# Patient Record
Sex: Female | Born: 1975 | Race: White | Hispanic: Yes | Marital: Married | Smoking: Never smoker
Health system: Southern US, Community
[De-identification: ages and names within clinical notes are randomized; demographics above are authoritative.]

## PROBLEM LIST (undated history)

## (undated) DIAGNOSIS — Z9889 Other specified postprocedural states: Secondary | ICD-10-CM

## (undated) DIAGNOSIS — R112 Nausea with vomiting, unspecified: Secondary | ICD-10-CM

## (undated) DIAGNOSIS — Z973 Presence of spectacles and contact lenses: Secondary | ICD-10-CM

## (undated) DIAGNOSIS — J45909 Unspecified asthma, uncomplicated: Secondary | ICD-10-CM

## (undated) DIAGNOSIS — D649 Anemia, unspecified: Secondary | ICD-10-CM

## (undated) DIAGNOSIS — G43109 Migraine with aura, not intractable, without status migrainosus: Secondary | ICD-10-CM

## (undated) HISTORY — DX: Migraine with aura, not intractable, without status migrainosus: G43.109

## (undated) HISTORY — DX: Anemia, unspecified: D64.9

## (undated) HISTORY — DX: Unspecified asthma, uncomplicated: J45.909

---
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
## 2013-12-29 ENCOUNTER — Ambulatory Visit (INDEPENDENT_AMBULATORY_CARE_PROVIDER_SITE_OTHER): Payer: 59 | Admitting: Family Medicine

## 2013-12-29 ENCOUNTER — Ambulatory Visit (INDEPENDENT_AMBULATORY_CARE_PROVIDER_SITE_OTHER): Payer: 59

## 2013-12-29 VITALS — BP 126/88 | HR 86 | Temp 97.9°F | Resp 18 | Ht 66.0 in | Wt 192.6 lb

## 2013-12-29 DIAGNOSIS — M436 Torticollis: Secondary | ICD-10-CM

## 2013-12-29 DIAGNOSIS — M542 Cervicalgia: Secondary | ICD-10-CM

## 2013-12-29 MED ORDER — METHOCARBAMOL 500 MG PO TABS: ORAL_TABLET | ORAL | Status: DC

## 2013-12-29 NOTE — Patient Instructions (Signed)
Use the neck collar as needed for support, but do not use it when you are driving. Use the robaxin as needed for neck pain but remember it may cause drowsiness. You can also try a heating pad, head patch, etc.  Let me know if you do not feel better in the next couple of days

## 2013-12-29 NOTE — Progress Notes (Signed)
[facility] [address], [place] [address] [phone]  Date:  12/29/2013   Name:  Stacy Ochoa   DOB:  1975/11/01   MRN:  [id number]  PCP:  No PCP Per Patient    Chief Complaint: Torticollis and Shoulder Pain   History of Present Illness:  Stacy Ochoa is a 38 y.o. very pleasant female patient who presents with the following:  She is here today with a pain in her neck.  This is Wednesday- on Sunday she noted onset of a "knot" in her thoracic back.  The next day she felt worse, and has stiffness on the left side of her neck also.   She feels a bit tingly in the left hand.   She has seen PM and R for a similar issue in the past and had an injection of some sort while she was living in [place]  She works for the [employer].  She is originally from [place] about 2 weeks ago.  No trauma, fall, MVA.    She is otherwise generally healthy and feels well   There are no active problems to display for this patient.   Past Medical History  Diagnosis Date  . Asthma     History reviewed. No pertinent past surgical history.  History  Substance Use Topics  . Smoking status: Never Smoker   . Smokeless tobacco: Not on file  . Alcohol Use: No    Family History  Problem Relation Age of Onset  . Hypertension Mother   . Diabetes Mother   . Heart disease Maternal Grandfather     Allergies  Allergen Reactions  . Aspirin     Medication list has been reviewed and updated.  No current outpatient prescriptions on file prior to visit.   No current facility-administered medications on file prior to visit.    Review of Systems:  As per HPI- otherwise negative.   Physical Examination: Filed Vitals:   12/29/13 1007  BP: 126/88  Pulse: 86  Temp: 97.9 F (36.6 C)  Resp: 18   Filed Vitals:   12/29/13 1007  Height: 5\' 6"  (1.676 m)  Weight: 192 lb 9.6 oz (87.363 kg)   Body mass index is 31.1 kg/(m^2). Ideal Body Weight: Weight  in (lb) to have BMI = 25: 154.6  GEN: WDWN, NAD, Non-toxic, A & O x 3, overweight, looks well HEENT: Atraumatic, Normocephalic. Neck supple. No masses, No LAD.  Bilateral TM wnl, oropharynx normal.  PEERL,EOMI.   She is tender over the right shoulder blade, right trapezius and right sided neck muscles. Good cervical ROM except she is slightly limited in rotation to the left  Ears and Nose: No external deformity. CV: RRR, No M/G/R. No JVD. No thrill. No extra heart sounds. PULM: CTA B, no wheezes, crackles, rhonchi. No retractions. No resp. distress. No accessory muscle use. EXTR: No c/c/e NEURO Normal gait.  PSYCH: Normally interactive. Conversant. Not depressed or anxious appearing.  Calm demeanor.   Tried a soft cervical collar which felt comfortable to her.    [facility] reading (PRIMARY) by  Dr. Patsy Lager. Cervical spine:  Loss of lordosis and minimal degenerative change, OW normal  CERVICAL SPINE 4+ VIEWS  COMPARISON: None.  FINDINGS: The cervical vertebrae are straightened in alignment. Intervertebral disc spaces appear normal. No prevertebral soft tissue swelling is seen. On oblique views the foramina are patent. The odontoid process is intact. The lung apices are clear.  IMPRESSION: Straightened alignment.  Normal disc spaces. No foraminal narrowing.  Assessment and Plan: Neck pain - Plan: DG Cervical Spine Complete, methocarbamol (ROBAXIN) 500 MG tablet  Torticollis - Plan: DG Cervical Spine Complete, methocarbamol (ROBAXIN) 500 MG tablet  Neck pain due to MSK strain/toritcollis.  Treat with robaxin as needed, soft collar as needed,  She will let me know if not better in the next few days  Signed Abbe Amsterdam, MD

## 2015-06-23 DIAGNOSIS — J4521 Mild intermittent asthma with (acute) exacerbation: Secondary | ICD-10-CM | POA: Insufficient documentation

## 2017-04-22 DIAGNOSIS — Z862 Personal history of diseases of the blood and blood-forming organs and certain disorders involving the immune mechanism: Secondary | ICD-10-CM

## 2017-04-22 HISTORY — DX: Personal history of diseases of the blood and blood-forming organs and certain disorders involving the immune mechanism: Z86.2

## 2017-09-05 ENCOUNTER — Ambulatory Visit (INDEPENDENT_AMBULATORY_CARE_PROVIDER_SITE_OTHER): Payer: 59 | Admitting: Obstetrics & Gynecology

## 2017-09-05 ENCOUNTER — Other Ambulatory Visit: Payer: Self-pay

## 2017-09-05 ENCOUNTER — Encounter: Payer: Self-pay | Admitting: Obstetrics & Gynecology

## 2017-09-05 VITALS — BP 110/70 | HR 88 | Resp 16 | Ht 64.5 in | Wt 177.2 lb

## 2017-09-05 DIAGNOSIS — N921 Excessive and frequent menstruation with irregular cycle: Secondary | ICD-10-CM | POA: Diagnosis not present

## 2017-09-05 DIAGNOSIS — G43909 Migraine, unspecified, not intractable, without status migrainosus: Secondary | ICD-10-CM | POA: Insufficient documentation

## 2017-09-05 DIAGNOSIS — D500 Iron deficiency anemia secondary to blood loss (chronic): Secondary | ICD-10-CM | POA: Diagnosis not present

## 2017-09-05 NOTE — Progress Notes (Signed)
GYNECOLOGY  VISIT  CC:   Heavy bleeding  HPI: 42 y.o. G70P2002 Married Hispanic female here as new patient for menstrual cycle problems.  Bleeding has been heavier during the past year and a half.  Flow has gotten much heavier during this time.  Does pass clots.  She developed some mild iron deficiency anemia.    Cycles used to be about every 28 days and are now every 201 or 24 days.  Flow lasts 5-7 days.  First three days her flow is very heavy.  She uses two pads at a time.  She has to change pads every two hours.  Clots can be larger than golf balls.  Does have migraines and these are very cycle related.  On preventative treatment that has really helped.    Also having some depression symptoms.    PCP:  Dr. Helane Rima.    GYNECOLOGIC HISTORY: Patient's last menstrual period was 08/28/2017. Contraception: vasectomy  Menopausal hormone therapy: none  Patient Active Problem List   Diagnosis Date Noted  . Migraines 09/05/2017  . Mild intermittent asthma with acute exacerbation 06/23/2015    Past Medical History:  Diagnosis Date  . Anemia 2019   due to heavy vag bleeding   . Asthma   . Migraine with aura    gets ~ 1-2 a month - controlled with medication     Past Surgical History:  Procedure Laterality Date  . BREAST REDUCTION SURGERY  1994    MEDS:   Current Outpatient Medications on File Prior to Visit  Medication Sig Dispense Refill  . EMGALITY 120 MG/ML SOAJ INJECT 120 MG SQ ONCE MONTHLY  10  . fluticasone (FLONASE) 50 MCG/ACT nasal spray Place 1 spray into both nostrils daily.    . montelukast (SINGULAIR) 10 MG tablet Take by mouth.    . Multiple Vitamin (MULTIVITAMIN) tablet Take 1 tablet by mouth daily.    Marland Kitchen topiramate (TOPAMAX) 100 MG tablet Take 100 mg by mouth daily.  0  . vitamin B-12 (CYANOCOBALAMIN) 1000 MCG tablet Take 1,000 mcg by mouth daily.    Marland Kitchen albuterol (PROVENTIL HFA) 108 (90 Base) MCG/ACT inhaler Inhale into the lungs daily as needed.    Marland Kitchen  albuterol (PROVENTIL) (2.5 MG/3ML) 0.083% nebulizer solution as needed.    . baclofen (LIORESAL) 10 MG tablet Take 1-2 tablets by mouth 3 (three) times daily as needed.    . eletriptan (RELPAX) 40 MG tablet Take 1 tablet by mouth daily as needed.    Marland Kitchen EPINEPHrine (EPIPEN 2-PAK) 0.3 mg/0.3 mL IJ SOAJ injection as directed.     No current facility-administered medications on file prior to visit.     ALLERGIES: Aspirin  Family History  Problem Relation Age of Onset  . Hypertension Mother   . Diabetes Mother   . Heart disease Maternal Grandfather   . Osteoporosis Maternal Aunt     SH:  Married, non smoker  Review of Systems  All other systems reviewed and are negative.   PHYSICAL EXAMINATION:    BP 110/70 (BP Location: Left Arm, Patient Position: Sitting, Cuff Size: Normal)   Pulse 88   Resp 16   Ht 5' 4.5" (1.638 m)   Wt 177 lb 3.2 oz (80.4 kg)   LMP 08/28/2017   BMI 29.95 kg/m     General appearance: alert, cooperative and appears stated age Neck: no adenopathy, supple, symmetrical, trachea midline and thyroid normal to inspection and palpation CV:  Regular rate and rhythm Lungs:  clear to  auscultation, no wheezes, rales or rhonchi, symmetric air entry Abdomen: soft, non-tender; bowel sounds normal; no masses,  no organomegaly  Pelvic: External genitalia:  no lesions              Urethra:  normal appearing urethra with no masses, tenderness or lesions              Bartholins and Skenes: normal                 Vagina: normal appearing vagina with normal color and discharge, no lesions              Cervix: no lesions              Bimanual Exam:  Uterus: about 10 weeks in size, retroflexed, fundus feels enlarged              Adnexa: no mass, fullness, tenderness  Chaperone was present for exam.  Assessment: Menorrhagia  Plan: TSH, Iron and TIBC, Ferritin, CBC obtained today Return for PUS.  Possible treatment options discussed.  Will review in more detail after  ultrasound is complete.   ~35 minutes spent with patient >50% of time was in face to face discussion of above.

## 2017-09-06 LAB — CBC
Hematocrit: 34.8 % (ref 34.0–46.6)
Hemoglobin: 10.4 g/dL — ABNORMAL LOW (ref 11.1–15.9)
MCH: 24.2 pg — AB (ref 26.6–33.0)
MCHC: 29.9 g/dL — ABNORMAL LOW (ref 31.5–35.7)
MCV: 81 fL (ref 79–97)
Platelets: 390 10*3/uL — ABNORMAL HIGH (ref 150–379)
RBC: 4.3 x10E6/uL (ref 3.77–5.28)
RDW: 15.9 % — AB (ref 12.3–15.4)
WBC: 7.3 10*3/uL (ref 3.4–10.8)

## 2017-09-06 LAB — IRON AND TIBC
Iron Saturation: 5 % — CL (ref 15–55)
Iron: 22 ug/dL — ABNORMAL LOW (ref 27–159)
TIBC: 465 ug/dL — AB (ref 250–450)
UIBC: 443 ug/dL — ABNORMAL HIGH (ref 131–425)

## 2017-09-06 LAB — TSH: TSH: 0.874 u[IU]/mL (ref 0.450–4.500)

## 2017-09-06 LAB — FERRITIN: Ferritin: 8 ng/mL — ABNORMAL LOW (ref 15–150)

## 2017-09-08 ENCOUNTER — Encounter: Payer: Self-pay | Admitting: Obstetrics & Gynecology

## 2017-09-08 ENCOUNTER — Other Ambulatory Visit: Payer: Self-pay | Admitting: *Deleted

## 2017-09-08 DIAGNOSIS — N921 Excessive and frequent menstruation with irregular cycle: Secondary | ICD-10-CM

## 2017-09-08 DIAGNOSIS — D500 Iron deficiency anemia secondary to blood loss (chronic): Secondary | ICD-10-CM

## 2017-09-08 NOTE — Addendum Note (Signed)
Addended by: Megan Salon on: 09/08/2017 10:44 PM   Modules accepted: Level of Service

## 2017-09-09 ENCOUNTER — Ambulatory Visit: Payer: 59 | Admitting: Obstetrics & Gynecology

## 2017-09-09 ENCOUNTER — Ambulatory Visit (INDEPENDENT_AMBULATORY_CARE_PROVIDER_SITE_OTHER): Payer: 59

## 2017-09-09 ENCOUNTER — Encounter: Payer: Self-pay | Admitting: Obstetrics & Gynecology

## 2017-09-09 VITALS — BP 102/70 | HR 72 | Resp 14 | Ht 64.5 in | Wt 177.0 lb

## 2017-09-09 DIAGNOSIS — D500 Iron deficiency anemia secondary to blood loss (chronic): Secondary | ICD-10-CM

## 2017-09-09 DIAGNOSIS — D219 Benign neoplasm of connective and other soft tissue, unspecified: Secondary | ICD-10-CM

## 2017-09-09 DIAGNOSIS — N921 Excessive and frequent menstruation with irregular cycle: Secondary | ICD-10-CM

## 2017-09-09 NOTE — Progress Notes (Signed)
Following appointment with Dr Sabra Heck, appointment scheduled with hematology for Sep 18, 2017 at 245. Surgery date options discussed for August 2019 following patient planned vacation.

## 2017-09-09 NOTE — Progress Notes (Signed)
42 y.o. G31P2002 Married Hispanic female here for pelvic ultrasound due to enlarged uterus on exam, iron deficiency anemia, and to discuss treatment plans.  Reports she's had more fatigue this week.  We reviewed lab work and iron levels.  Aware she needs hematology referral and iron transfusions to improve iron levels.  Pt comfortable with this plan.   Patient's last menstrual period was 08/28/2017.  Contraception: vasectomy  Findings:  UTERUS: 10 x 5 x 5cm with 5.2 x 4.1cm and 4.2 x 3.0cm fibroids that appear pedunculated at the left fundal region as well as a 1 x 1cm intramural fibroid. EMS: 8.24mm ADNEXA: Left ovary: 2.2 x 1.6 x 1.5cm       Right ovary: 2.5 x 2.0 x 1.5cm CUL DE SAC: no free fluid  Discussion:  Findings reviewed with pt.  Options for fibroid treatment reviewed--myomectomy, uterine artery embolization, progesterone IUD, hysterectomy.  I do not think an ablation will work that well for her due to size of fibroids.  She is considering hysterectomy.  I think, she will feel much better with an iron transfusion and improved hemoglobin.  Also, I think she will recover better from surgery if iron levels are improved as well.  She is very comfortable with plan.   Assessment:  Iron deficiency anemia Fibroid uterus  Plan:  Referral to hematology with probable iron transfusions.  Information about hysterectomy given.  She has already done reading about fibroid treatment.  ~20 minutes spent with patient >50% of time was in face to face discussion of above.

## 2017-09-17 ENCOUNTER — Other Ambulatory Visit: Payer: Self-pay | Admitting: Family

## 2017-09-17 DIAGNOSIS — E559 Vitamin D deficiency, unspecified: Secondary | ICD-10-CM

## 2017-09-17 DIAGNOSIS — D649 Anemia, unspecified: Secondary | ICD-10-CM

## 2017-09-17 DIAGNOSIS — D510 Vitamin B12 deficiency anemia due to intrinsic factor deficiency: Secondary | ICD-10-CM

## 2017-09-18 ENCOUNTER — Inpatient Hospital Stay (HOSPITAL_BASED_OUTPATIENT_CLINIC_OR_DEPARTMENT_OTHER): Payer: 59 | Admitting: Family

## 2017-09-18 ENCOUNTER — Other Ambulatory Visit: Payer: Self-pay

## 2017-09-18 ENCOUNTER — Inpatient Hospital Stay: Payer: 59 | Attending: Hematology & Oncology

## 2017-09-18 ENCOUNTER — Encounter: Payer: Self-pay | Admitting: Family

## 2017-09-18 DIAGNOSIS — D510 Vitamin B12 deficiency anemia due to intrinsic factor deficiency: Secondary | ICD-10-CM

## 2017-09-18 DIAGNOSIS — D500 Iron deficiency anemia secondary to blood loss (chronic): Secondary | ICD-10-CM | POA: Diagnosis not present

## 2017-09-18 DIAGNOSIS — N920 Excessive and frequent menstruation with regular cycle: Secondary | ICD-10-CM | POA: Insufficient documentation

## 2017-09-18 DIAGNOSIS — D649 Anemia, unspecified: Secondary | ICD-10-CM

## 2017-09-18 DIAGNOSIS — D509 Iron deficiency anemia, unspecified: Secondary | ICD-10-CM | POA: Insufficient documentation

## 2017-09-18 DIAGNOSIS — E559 Vitamin D deficiency, unspecified: Secondary | ICD-10-CM

## 2017-09-18 LAB — CBC WITH DIFFERENTIAL (CANCER CENTER ONLY)
Basophils Absolute: 0 10*3/uL (ref 0.0–0.1)
Basophils Relative: 0 %
Eosinophils Absolute: 0.1 10*3/uL (ref 0.0–0.5)
Eosinophils Relative: 1 %
HEMATOCRIT: 33.9 % — AB (ref 34.8–46.6)
Hemoglobin: 10.5 g/dL — ABNORMAL LOW (ref 11.6–15.9)
LYMPHS ABS: 1.7 10*3/uL (ref 0.9–3.3)
LYMPHS PCT: 25 %
MCH: 24.8 pg — ABNORMAL LOW (ref 26.0–34.0)
MCHC: 31 g/dL — AB (ref 32.0–36.0)
MCV: 80 fL — AB (ref 81.0–101.0)
MONO ABS: 0.4 10*3/uL (ref 0.1–0.9)
MONOS PCT: 6 %
NEUTROS ABS: 4.6 10*3/uL (ref 1.5–6.5)
Neutrophils Relative %: 68 %
Platelet Count: 298 10*3/uL (ref 145–400)
RBC: 4.24 MIL/uL (ref 3.70–5.32)
RDW: 15.2 % (ref 11.1–15.7)
WBC Count: 6.9 10*3/uL (ref 3.9–10.0)

## 2017-09-18 LAB — CMP (CANCER CENTER ONLY)
ALBUMIN: 4 g/dL (ref 3.5–5.0)
ALK PHOS: 99 U/L (ref 40–150)
ALT: 12 U/L (ref 0–55)
AST: 15 U/L (ref 5–34)
Anion gap: 8 (ref 3–11)
BILIRUBIN TOTAL: 0.3 mg/dL (ref 0.2–1.2)
BUN: 16 mg/dL (ref 7–26)
CO2: 22 mmol/L (ref 22–29)
Calcium: 9.4 mg/dL (ref 8.4–10.4)
Chloride: 108 mmol/L (ref 98–109)
Creatinine: 0.95 mg/dL (ref 0.60–1.10)
GFR, Est AFR Am: >60 mL/min (ref >60)
GFR, Estimated: >60 mL/min (ref >60)
GLUCOSE: 114 mg/dL (ref 70–140)
Potassium: 3.5 mmol/L (ref 3.5–5.1)
Sodium: 138 mmol/L (ref 136–145)
TOTAL PROTEIN: 7.3 g/dL (ref 6.4–8.3)

## 2017-09-18 LAB — SAVE SMEAR

## 2017-09-18 LAB — RETICULOCYTES
RBC.: 4.27 MIL/uL (ref 3.70–5.45)
Retic Count, Absolute: 42.7 10*3/uL (ref 33.7–90.7)
Retic Ct Pct: 1 % (ref 0.7–2.1)

## 2017-09-18 LAB — VITAMIN B12: Vitamin B-12: 295 pg/mL (ref 180–914)

## 2017-09-18 NOTE — Progress Notes (Signed)
Hematology/Oncology Consultation   Name: Camella Seim      MRN: [id number]    Location: Room/bed info not found  Date: 09/18/2017 Time:2:41 PM   REFERRING PHYSICIAN: Megan Salon, MD  REASON FOR CONSULT: Iron deficiency anemia due to chronic blood loss    DIAGNOSIS: Iron deficiency secondary to   HISTORY OF PRESENT ILLNESS: Ms. Segar is a very pleasant 42 yo [place] female with iron deficiency anemia secondary to heavy cycles. She is scheduled for a hysterectomy in August.  Her ferritin is 8 and iron saturation 5%. She is symptomatic at this time with weakness, fatigue, lightheadedness, palpitations, SOB with any exertion, chewing ice, bruising easily and numbness and tingling in her extremities.  She has had no other issues with bleeding. She has had surgery (breast reduction) without complication.  She has 2 healthy sons and no history of miscarriage.  No family history of anemia or bleeding disorder.  No personal or familial history of cancer that she is aware of.  No fever, chills, n/v, cough, rash, chest pain, abdominal pain or changes in bowel or bladder habits.  No swelling or tenderness in her extremities. No c/o pain. She has a good appetite and is staying well hydrated. Her weight is stable.  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  She is excited to go home for a visit to [place] in July and is hoping to feel better for her trip.    ROS: All other 10 point review of systems is negative.   PAST MEDICAL HISTORY:   Past Medical History:  Diagnosis Date  . Anemia 2019   due to heavy vag bleeding   . Asthma   . Migraine with aura    gets ~ 1-2 a month - controlled with medication     ALLERGIES: Allergies  Allergen Reactions  . Aspirin Anaphylaxis      MEDICATIONS:  Current Outpatient Medications on File Prior to Visit  Medication Sig Dispense Refill  . albuterol (PROVENTIL HFA) 108 (90 Base) MCG/ACT inhaler  Inhale into the lungs daily as needed.    Marland Kitchen albuterol (PROVENTIL) (2.5 MG/3ML) 0.083% nebulizer solution as needed.    . baclofen (LIORESAL) 10 MG tablet Take 1-2 tablets by mouth 3 (three) times daily as needed.    . eletriptan (RELPAX) 40 MG tablet Take 1 tablet by mouth daily as needed.    Marland Kitchen EMGALITY 120 MG/ML SOAJ INJECT 120 MG SQ ONCE MONTHLY  10  . EPINEPHrine (EPIPEN 2-PAK) 0.3 mg/0.3 mL IJ SOAJ injection as directed.    . fluticasone (FLONASE) 50 MCG/ACT nasal spray Place 1 spray into both nostrils daily.    . montelukast (SINGULAIR) 10 MG tablet Take by mouth.    . Multiple Vitamin (MULTIVITAMIN) tablet Take 1 tablet by mouth daily.    Marland Kitchen topiramate (TOPAMAX) 100 MG tablet Take 100 mg by mouth daily.  0  . vitamin B-12 (CYANOCOBALAMIN) 1000 MCG tablet Take 1,000 mcg by mouth daily.     No current facility-administered medications on file prior to visit.      PAST SURGICAL HISTORY Past Surgical History:  Procedure Laterality Date  . BREAST REDUCTION SURGERY  1994    FAMILY HISTORY: Family History  Problem Relation Age of Onset  . Hypertension Mother   . Diabetes Mother   . Heart disease Maternal Grandfather   . Osteoporosis Maternal Aunt     SOCIAL HISTORY:  reports that she has  never smoked. She has never used smokeless tobacco. She reports that she does not drink alcohol or use drugs.  PERFORMANCE STATUS: The patient's performance status is 1 - Symptomatic but completely ambulatory  PHYSICAL EXAM: Most Recent Vital Signs: Last menstrual period 08/28/2017. BP 121/83 (BP Location: Left Arm, Patient Position: Sitting)   Pulse (!) 104   Temp 98.6 F (37 C) (Oral)   Resp 16   Wt 178 lb (80.7 kg)   LMP 08/28/2017   SpO2 100%   BMI 30.08 kg/m   General Appearance:    Alert, cooperative, no distress, appears stated age  Head:    Normocephalic, without obvious abnormality, atraumatic  Eyes:    PERRL, conjunctiva/corneas clear, EOM's intact, fundi    benign, both  eyes  Ears:    Normal TM's and external ear canals, both ears  Nose:   Nares normal, septum midline, mucosa normal, no drainage    or sinus tenderness  Throat:   Lips, mucosa, and tongue normal; teeth and gums normal  Neck:   Supple, symmetrical, trachea midline, no adenopathy;    thyroid:  no enlargement/tenderness/nodules; no carotid   bruit or JVD  Back:     Symmetric, no curvature, ROM normal, no CVA tenderness  Lungs:     Clear to auscultation bilaterally, respirations unlabored  Chest Wall:    No tenderness or deformity   Heart:    Regular rate and rhythm, S1 and S2 normal, no murmur, rub   or gallop  Breast Exam:    No tenderness, masses, or nipple abnormality  Abdomen:     Soft, non-tender, bowel sounds active all four quadrants,    no masses, no organomegaly  Genitalia:    Normal female without lesion, discharge or tenderness  Rectal:    Normal tone, normal prostate, no masses or tenderness;   guaiac negative stool  Extremities:   Extremities normal, atraumatic, no cyanosis or edema  Pulses:   2+ and symmetric all extremities  Skin:   Skin color, texture, turgor normal, no rashes or lesions  Lymph nodes:   Cervical, supraclavicular, and axillary nodes normal  Neurologic:   CNII-XII intact, normal strength, sensation and reflexes    throughout    LABORATORY DATA:  Results for orders placed or performed in visit on 09/18/17 (from the past 48 hour(s))  CBC with Differential ([place] Only)     Status: Abnormal   Collection Time: 09/18/17  2:16 PM  Result Value Ref Range   WBC Count 6.9 3.9 - 10.0 K/uL   RBC 4.24 3.70 - 5.32 MIL/uL   Hemoglobin 10.5 (L) 11.6 - 15.9 g/dL   HCT 33.9 (L) 34.8 - 46.6 %   MCV 80.0 (L) 81.0 - 101.0 fL   MCH 24.8 (L) 26.0 - 34.0 pg   MCHC 31.0 (L) 32.0 - 36.0 g/dL   RDW 15.2 11.1 - 15.7 %   Platelet Count 298 145 - 400 K/uL   Neutrophils Relative % 68 %   Neutro Abs 4.6 1.5 - 6.5 K/uL   Lymphocytes Relative 25 %   Lymphs Abs 1.7 0.9 - 3.3  K/uL   Monocytes Relative 6 %   Monocytes Absolute 0.4 0.1 - 0.9 K/uL   Eosinophils Relative 1 %   Eosinophils Absolute 0.1 0.0 - 0.5 K/uL   Basophils Relative 0 %   Basophils Absolute 0.0 0.0 - 0.1 K/uL    Comment: Performed at [facility], [address] [place], [place] [address]      RADIOGRAPHY: No results found.     PATHOLOGY: None  ASSESSMENT/PLAN: Ms. Norby is a very pleasant 42 yo [place] female with iron deficiency anemia secondary to heavy cycles. Iron saturation was 5% and ferritin 8. She is scheduled for a hysterectomy in August.  She is quite symptomatic as mentioned above.  We will give her iron tomorrow and again next week then follow-up in 6 weeks.   All questions were answered and she is in agreement with the plan. She will contact our office with any questions or concerns. We can certainly see her sooner if need be.   She was discussed with and also seen by Dr. Marin Olp and he is in agreement with the aforementioned.   Laverna Peace     Addendum: I saw and examined the patient with Judson Roch.  I agree with the above assessment by Judson Roch.  I looked at her smear under the microscope.  She had some microcytic red blood cells.  She had no nucleated red blood cells.  She had some hypochromic red blood cells.  Her iron studies clearly are low.  Her iron saturation is only 5%.  Her ferritin is 8.  She does have a normal vitamin B12 level.  We will go ahead and give her a dose of IV iron.  I know that this will help her out.  I do not see that any additional studies need to be done.  We spent a total of 40 minutes with her today.  We spent the time counseling her and answering all of her questions.  She was very satisfied with her meeting.  She was reassured that everything is going to be okay.  Lattie Haw, MD

## 2017-09-19 ENCOUNTER — Telehealth: Payer: Self-pay | Admitting: Family

## 2017-09-19 ENCOUNTER — Other Ambulatory Visit: Payer: Self-pay | Admitting: Family

## 2017-09-19 ENCOUNTER — Inpatient Hospital Stay: Payer: 59

## 2017-09-19 VITALS — BP 105/73 | HR 84 | Temp 97.6°F | Resp 16

## 2017-09-19 DIAGNOSIS — D500 Iron deficiency anemia secondary to blood loss (chronic): Secondary | ICD-10-CM

## 2017-09-19 DIAGNOSIS — E559 Vitamin D deficiency, unspecified: Secondary | ICD-10-CM

## 2017-09-19 LAB — LACTATE DEHYDROGENASE: LDH: 153 U/L (ref 125–245)

## 2017-09-19 LAB — VITAMIN D 25 HYDROXY (VIT D DEFICIENCY, FRACTURES): Vit D, 25-Hydroxy: 16.3 ng/mL — ABNORMAL LOW (ref 30.0–100.0)

## 2017-09-19 MED ORDER — ERGOCALCIFEROL 1.25 MG (50000 UT) PO CAPS: 50000.0000 [IU] | ORAL_CAPSULE | ORAL | 6 refills | Status: DC

## 2017-09-19 MED ORDER — SODIUM CHLORIDE 0.9 % IV SOLN
Freq: Once | INTRAVENOUS | Status: AC
  Administered 2017-09-19: 13:00:00 via INTRAVENOUS

## 2017-09-19 MED ORDER — SODIUM CHLORIDE 0.9 % IV SOLN
510.0000 mg | Freq: Once | INTRAVENOUS | Status: AC
  Administered 2017-09-19: 510 mg via INTRAVENOUS
  Filled 2017-09-19: qty 17

## 2017-09-19 NOTE — Patient Instructions (Signed)

## 2017-09-19 NOTE — Telephone Encounter (Signed)
I spoke with Ms. Jerrett and let her know her B 12 looked good and Vitamin D is low at 16. Prescription was sent for Vitamin D 50,000 units PO weekly. She verbalized understanding. No questions at this time.

## 2017-09-26 ENCOUNTER — Other Ambulatory Visit: Payer: Self-pay

## 2017-09-26 ENCOUNTER — Inpatient Hospital Stay: Payer: 59 | Attending: Hematology & Oncology

## 2017-09-26 VITALS — BP 107/74 | HR 80 | Temp 98.9°F | Resp 20

## 2017-09-26 DIAGNOSIS — N920 Excessive and frequent menstruation with regular cycle: Secondary | ICD-10-CM | POA: Diagnosis not present

## 2017-09-26 DIAGNOSIS — D500 Iron deficiency anemia secondary to blood loss (chronic): Secondary | ICD-10-CM | POA: Insufficient documentation

## 2017-09-26 MED ORDER — SODIUM CHLORIDE 0.9 % IV SOLN
510.0000 mg | Freq: Once | INTRAVENOUS | Status: AC
  Administered 2017-09-26: 510 mg via INTRAVENOUS
  Filled 2017-09-26: qty 17

## 2017-09-26 MED ORDER — SODIUM CHLORIDE 0.9 % IV SOLN
Freq: Once | INTRAVENOUS | Status: AC
  Administered 2017-09-26: 15:00:00 via INTRAVENOUS

## 2017-09-26 NOTE — Patient Instructions (Signed)

## 2017-11-06 ENCOUNTER — Telehealth: Payer: Self-pay | Admitting: *Deleted

## 2017-11-06 NOTE — Telephone Encounter (Signed)
Call to patient. Confirmed surgery date of 12-08-17 at 0730 at [facility]. Arrive at 0530. Surgery instruction sheet reviewed and printed copy will be provided at consult appointment on 11-10-17 with brochure from surgery center.    Routing to provider for review.  Will close encounter.

## 2017-11-07 ENCOUNTER — Inpatient Hospital Stay: Payer: 59 | Attending: Hematology & Oncology

## 2017-11-07 ENCOUNTER — Inpatient Hospital Stay (HOSPITAL_BASED_OUTPATIENT_CLINIC_OR_DEPARTMENT_OTHER): Payer: 59 | Admitting: Family

## 2017-11-07 VITALS — BP 114/81 | HR 74 | Temp 98.9°F | Wt 177.5 lb

## 2017-11-07 DIAGNOSIS — D500 Iron deficiency anemia secondary to blood loss (chronic): Secondary | ICD-10-CM | POA: Diagnosis present

## 2017-11-07 DIAGNOSIS — N920 Excessive and frequent menstruation with regular cycle: Secondary | ICD-10-CM

## 2017-11-07 LAB — RETICULOCYTES
RBC.: 4.67 MIL/uL (ref 3.87–5.11)
RETIC CT PCT: 0.8 % (ref 0.4–3.1)
Retic Count, Absolute: 37.4 10*3/uL (ref 19.0–186.0)

## 2017-11-07 LAB — CBC WITH DIFFERENTIAL (CANCER CENTER ONLY)
Basophils Absolute: 0 10*3/uL (ref 0.0–0.1)
Basophils Relative: 0 %
EOS ABS: 0.1 10*3/uL (ref 0.0–0.5)
Eosinophils Relative: 1 %
HCT: 40.6 % (ref 34.8–46.6)
Hemoglobin: 13.5 g/dL (ref 11.6–15.9)
LYMPHS ABS: 1.8 10*3/uL (ref 0.9–3.3)
LYMPHS PCT: 23 %
MCH: 29.1 pg (ref 26.0–34.0)
MCHC: 33.3 g/dL (ref 32.0–36.0)
MCV: 87.5 fL (ref 81.0–101.0)
MONO ABS: 0.5 10*3/uL (ref 0.1–0.9)
MONOS PCT: 7 %
Neutro Abs: 5.3 10*3/uL (ref 1.5–6.5)
Neutrophils Relative %: 69 %
Platelet Count: 262 10*3/uL (ref 145–400)
RBC: 4.64 MIL/uL (ref 3.70–5.32)
RDW: 17.3 % — ABNORMAL HIGH (ref 11.1–15.7)
WBC Count: 7.7 10*3/uL (ref 3.9–10.0)

## 2017-11-07 NOTE — Progress Notes (Signed)
Hematology and Oncology Follow Up Visit  Stacy Ochoa [id number] [birth year] 42 y.o. 11/07/2017   Principle Diagnosis:  Iron deficiency anemia due to heavy cycles Vitamin D deficiency  Current Therapy:   Iv iron as indicated - last received in May/June 2019 x 2 Vitamin D 50,000 units PO once a week   Interim History:  Stacy Ochoa is here today for follow-up. She did well with 2 doses of IV iron in May/June. She states that she started having symptoms again 2 weeks ago. She has noticed fatigue, feet/hand cramps, dizziness, chills, numbness in hands and dry skin/itching.  She started her cycle today and has been coming every 24 days. He flow is heavy. She is scheduled for her hysterectomy in August 19th.  She has had no other episodes of bleeding, no bruising or petechiae.  No fever, n/v, cough, rash, SOB, chest pain, palpitations, abdominal pain or changes in bowel or bladder habits.  No swelling or tenderness in her extremities at this time. No lymphadenopathy noted on exam.  She has a good appetite but admits that she needs to hydrate better. Her weight is stable.   ECOG Performance Status: 1 - Symptomatic but completely ambulatory  Medications:  Allergies as of 11/07/2017      Reactions   Aspirin Anaphylaxis      Medication List        Accurate as of 11/07/17  3:11 PM. Always use your most recent med list.          albuterol (2.5 MG/3ML) 0.083% nebulizer solution Commonly known as:  PROVENTIL as needed.   PROVENTIL HFA 108 (90 Base) MCG/ACT inhaler Generic drug:  albuterol Inhale into the lungs daily as needed.   baclofen 10 MG tablet Commonly known as:  LIORESAL Take 1-2 tablets by mouth 3 (three) times daily as needed.   EMGALITY 120 MG/ML Soaj Generic drug:  Galcanezumab-gnlm INJECT 120 MG SQ ONCE MONTHLY   EPIPEN 2-PAK 0.3 mg/0.3 mL Soaj injection Generic drug:  EPINEPHrine as directed.   ergocalciferol 50000 units capsule Commonly known as:  VITAMIN  D2 Take 1 capsule (50,000 Units total) by mouth once a week.   fluticasone 50 MCG/ACT nasal spray Commonly known as:  FLONASE Place 1 spray into both nostrils daily.   montelukast 10 MG tablet Commonly known as:  SINGULAIR Take by mouth.   multivitamin tablet Take 1 tablet by mouth daily.   RELPAX 40 MG tablet Generic drug:  eletriptan Take 1 tablet by mouth daily as needed.   topiramate 100 MG tablet Commonly known as:  TOPAMAX Take 100 mg by mouth daily.   vitamin B-12 1000 MCG tablet Commonly known as:  CYANOCOBALAMIN Take 1,000 mcg by mouth daily.       Allergies:  Allergies  Allergen Reactions  . Aspirin Anaphylaxis    Past Medical History, Surgical history, Social history, and Family History were reviewed and updated.  Review of Systems: All other 10 point review of systems is negative.   Physical Exam:  vitals were not taken for this visit.   Wt Readings from Last 3 Encounters:  09/18/17 178 lb (80.7 kg)  09/09/17 177 lb (80.3 kg)  09/05/17 177 lb 3.2 oz (80.4 kg)    Ocular: Sclerae unicteric, pupils equal, round and reactive to light Ear-nose-throat: Oropharynx clear, dentition fair Lymphatic: No cervical, supraclavicular or axillary adenopathy Lungs no rales or rhonchi, good excursion bilaterally Heart regular rate and rhythm, no murmur appreciated Abd soft, nontender, positive bowel sounds, no liver  or spleen tip palpated on exam, no fluid wave  MSK no focal spinal tenderness, no joint edema Neuro: non-focal, well-oriented, appropriate affect Breasts: Deferred   Lab Results  Component Value Date   WBC 7.7 11/07/2017   HGB 13.5 11/07/2017   HCT 40.6 11/07/2017   MCV 87.5 11/07/2017   PLT 262 11/07/2017   Lab Results  Component Value Date   FERRITIN 8 (L) 09/05/2017   IRON 22 (L) 09/05/2017   TIBC 465 (H) 09/05/2017   UIBC 443 (H) 09/05/2017   IRONPCTSAT 5 (LL) 09/05/2017   Lab Results  Component Value Date   RETICCTPCT 1.0  09/18/2017   RBC 4.64 11/07/2017   No results found for: KPAFRELGTCHN, LAMBDASER, KAPLAMBRATIO No results found for: IGGSERUM, IGA, IGMSERUM No results found for: Marda Stalker, SPEI   Chemistry      Component Value Date/Time   NA 138 09/18/2017 1416   K 3.5 09/18/2017 1416   CL 108 09/18/2017 1416   CO2 22 09/18/2017 1416   BUN 16 09/18/2017 1416   CREATININE 0.95 09/18/2017 1416      Component Value Date/Time   CALCIUM 9.4 09/18/2017 1416   ALKPHOS 99 09/18/2017 1416   AST 15 09/18/2017 1416   ALT 12 09/18/2017 1416   BILITOT 0.3 09/18/2017 1416      Impression and Plan: Stacy Ochoa is a very pleasant 42 yo [place] female with iron deficiency anemia secondary to heavy cycles. She is symptomatic as mentioned above.  We will see what her iron studies show and bring her back in for infusion if needed.  We will plan to see her back in another 8 weeks for follow-up.  She will contact our office with any questions or concerns. We can certainly see her sooner if need be.    Emeline Gins, NP 7/19/20193:11 PM

## 2017-11-10 ENCOUNTER — Other Ambulatory Visit (HOSPITAL_COMMUNITY)
Admission: RE | Admit: 2017-11-10 | Discharge: 2017-11-10 | Disposition: A | Discharge status: Home / Self Care | Payer: 59 | Source: Ambulatory Visit | Attending: Obstetrics & Gynecology | Admitting: Obstetrics & Gynecology

## 2017-11-10 ENCOUNTER — Ambulatory Visit: Payer: 59 | Admitting: Obstetrics & Gynecology

## 2017-11-10 ENCOUNTER — Other Ambulatory Visit: Payer: Self-pay

## 2017-11-10 ENCOUNTER — Encounter: Payer: Self-pay | Admitting: Obstetrics & Gynecology

## 2017-11-10 VITALS — BP 116/70 | HR 80 | Resp 16 | Ht 64.5 in | Wt 181.8 lb

## 2017-11-10 DIAGNOSIS — N920 Excessive and frequent menstruation with regular cycle: Secondary | ICD-10-CM | POA: Diagnosis not present

## 2017-11-10 DIAGNOSIS — D219 Benign neoplasm of connective and other soft tissue, unspecified: Secondary | ICD-10-CM

## 2017-11-10 DIAGNOSIS — Z124 Encounter for screening for malignant neoplasm of cervix: Secondary | ICD-10-CM | POA: Diagnosis not present

## 2017-11-10 LAB — IRON AND TIBC
IRON: 70 ug/dL (ref 41–142)
Saturation Ratios: 22 % (ref 21–57)
TIBC: 318 ug/dL (ref 236–444)
UIBC: 248 ug/dL

## 2017-11-10 LAB — FERRITIN: FERRITIN: 109 ng/mL (ref 11–307)

## 2017-11-10 NOTE — Progress Notes (Signed)
42 y.o. [id number] MarriedHispanic female here to discuss iron deficiency anemia and proceed with surgical planning.  She has decided she would like to proceed with hysterectomy.  Reports she is feeling much better after having two iron transfusions.  Has seen Laverna Peace, [place], hematology.  Just had blood work done last Friday.  Is going to check about having one additional iron transfusion.  She has decided she would like to proceed with hysterectomy.  TLH/bilateral salpingectomy/cysotscopy discussed with pt.  I feel she is a good candidate for this.  She has considered alternatives including [place], myomectomy, hormonal management but had decided to proceed with definitive treatment.  Procedure discussed with patient.  Hospital stay, recovery and pain management all discussed.  Risks discussed including but not limited to bleeding, 1% risk of receiving a  transfusion, infection, 3-4% risk of bowel/bladder/ureteral/vascular injury discussed as well as possible need for additional surgery if injury does occur discussed.  DVT/PE and rare risk of death discussed.  My actual complications with prior surgeries discussed.  Vaginal cuff dehiscence discussed.  Hernia formation discussed.  Positioning and incision locations discussed.  Patient aware if pathology abnormal she may need additional treatment.  All questions answered.    Recommend proceeding with endometrial biopsy today.  Ultrasound performed 09/09/17.  Results showed:  UTERUS: 10 x 5 x 5cm with 5.2 x 4.1cm and 4.2 x 3.0cm fibroids that appear pedunculated at the left fundal region as well as a 1 x 1cm intramural fibroid. EMS: 8.14mm ADNEXA: Left ovary: 2.2 x 1.6 x 1.5cm                  Right ovary: 2.5 x 2.0 x 1.5cm CUL DE SAC: no free fluid   Getting ready to go to [place] for fun.  She will be gone for two weeks.    Ob Hx:   Patient's last menstrual period was 11/06/2017.          Sexually active: Yes.   Birth control: vasectomy Last  pap: 04/17/15 Neg. HR HPV:Neg - Care everywhere Last MMG: 09/23/17 BIRADS1:Neg - Care everywhere  Tobacco: No  Past Surgical History:  Procedure Laterality Date  . BREAST REDUCTION SURGERY  1994    Past Medical History:  Diagnosis Date  . Anemia 2019   due to heavy vag bleeding   . Asthma   . Migraine with aura    gets ~ 1-2 a month - controlled with medication     Allergies: Aspirin and Shellfish allergy  Current Outpatient Medications  Medication Sig Dispense Refill  . albuterol (PROVENTIL HFA) 108 (90 Base) MCG/ACT inhaler Inhale into the lungs daily as needed.    Marland Kitchen albuterol (PROVENTIL) (2.5 MG/3ML) 0.083% nebulizer solution as needed.    . baclofen (LIORESAL) 10 MG tablet Take 1-2 tablets by mouth 3 (three) times daily as needed.    . eletriptan (RELPAX) 40 MG tablet Take 1 tablet by mouth daily as needed.    Marland Kitchen EMGALITY 120 MG/ML SOAJ INJECT 120 MG SQ ONCE MONTHLY  10  . ergocalciferol (VITAMIN D2) 50000 units capsule Take 1 capsule (50,000 Units total) by mouth once a week. 8 capsule 6  . fluticasone (FLONASE) 50 MCG/ACT nasal spray Place 1 spray into both nostrils daily.    . montelukast (SINGULAIR) 10 MG tablet Take 10 mg by mouth at bedtime.     . Multiple Vitamin (MULTIVITAMIN) tablet Take 1 tablet by mouth daily.    Marland Kitchen topiramate (TOPAMAX) 100 MG tablet Take  100 mg by mouth daily.  0  . EPINEPHrine (EPIPEN 2-PAK) 0.3 mg/0.3 mL IJ SOAJ injection as directed.     No current facility-administered medications for this visit.     ROS: Pertinent items noted in HPI and remainder of comprehensive ROS otherwise negative.  Exam:    BP 116/70 (BP Location: Right Arm, Patient Position: Sitting, Cuff Size: Large)   Pulse 80   Resp 16   Ht 5' 4.5" (1.638 m)   Wt 181 lb 12.8 oz (82.5 kg)   LMP 11/06/2017   BMI 30.72 kg/m   General appearance: alert and cooperative Head: Normocephalic, without obvious abnormality, atraumatic Neck: no adenopathy, supple, symmetrical, trachea  midline and thyroid not enlarged, symmetric, no tenderness/mass/nodules Lungs: clear to auscultation bilaterally Heart: regular rate and rhythm, S1, S2 normal, no murmur, click, rub or gallop Abdomen: soft, non-tender; bowel sounds normal; no masses,  no organomegaly Extremities: extremities normal, atraumatic, no cyanosis or edema Skin: Skin color, texture, turgor normal. No rashes or lesions Lymph nodes: Cervical, supraclavicular, and axillary nodes normal. no inguinal nodes palpated Neurologic: Grossly normal  Pelvic: External genitalia:  no lesions              Urethra: normal appearing urethra with no masses, tenderness or lesions              Bartholins and Skenes: normal                 Vagina: normal appearing vagina with normal color and discharge, no lesions              Cervix: normal appearance              Pap taken: Yes.          Bimanual Exam:  Uterus:  uterus is normal size, shape, consistency and nontender, enlarged to 10 week's size                                      Adnexa:    normal adnexa in size, nontender and no masses  Endometrial biopsy recommended.  Discussed with patient.  Verbal and written consent obtained.   Procedure:  Speculum placed.  Cervix visualized and cleansed with betadine prep.  A single toothed tenaculum was applied to the anterior lip of the cervix.  Endometrial pipelle was advanced through the cervix into the endometrial cavity without difficulty.  Pipelle passed to 8cm.  Suction applied and pipelle removed with good tissue sample obtained.  Tenculum removed.  No bleeding noted.  Patient tolerated procedure well.   A: Menorrhagia Iron deficiency anemia, s/p iron transfusions x 2 with improved hemoglobin Fibroid uterus  P:  Proceed with surgical planning for TLH/bilateral salpingectomy/cystoscopy  Endometrial biopsy and pap smear pending.  Results will be called to pt. Pre and post op instructions reviewed.  Questions answered.

## 2017-11-12 LAB — CYTOLOGY - PAP
Diagnosis: NEGATIVE
HPV: NOT DETECTED

## 2017-11-14 ENCOUNTER — Other Ambulatory Visit: Payer: Self-pay | Admitting: Obstetrics & Gynecology

## 2017-11-28 NOTE — Patient Instructions (Addendum)
Stacy Ochoa  11/28/2017      Your procedure is scheduled on 12-08-17   Report to [facility]  at  5:30 A.M.  Call this number if you have problems the morning of surgery:[phone]  OUR ADDRESS IS [address], WE ARE [facility]ATED [facility] WITH [facility].   Remember:  Do not eat food or drink liquids after midnight.  Take these medicines the morning of surgery with A SIP OF WATER: Topamax (Topiramate), Eletroptan (Relpax) as needed. You may also have to bring and use your nasal spray as need   Do not wear jewelry, make-up or nail polish.  Do not wear lotions, powders, or perfumes, or deoderant.  Do not shave 48 hours prior to surgery.  Men may shave face and neck.  Do not bring valuables to the hospital.  [facility] is not responsible for any belongings or valuables.  Contacts, dentures or bridgework may not be worn into surgery.  Leave your suitcase in the car.  After surgery it may be brought to your room.  For patients admitted to the hospital, discharge time will be determined by your treatment team.  Patients discharged the day of surgery will not be allowed to drive home.   Special instructions:  Please bring your medications in their original pill bottles   Please read over the following fact sheets that you were given:       [facility] - Preparing for Surgery Before surgery, you can play an important role.  Because skin is not sterile, your skin needs to be as free of germs as possible.  You can reduce the number of germs on your skin by washing with CHG (chlorahexidine gluconate) soap before surgery.  CHG is an antiseptic cleaner which kills germs and bonds with the skin to continue killing germs even after washing. Please DO NOT use if you have an allergy to CHG or antibacterial soaps.  If your skin becomes reddened/irritated stop using the CHG and inform your nurse when you arrive at Short Stay. Do not shave (including legs and  underarms) for at least 48 hours prior to the first CHG shower.  You may shave your face/neck. Please follow these instructions carefully:  1.  Shower with CHG Soap the night before surgery and the  morning of Surgery.  2.  If you choose to wash your hair, wash your hair first as usual with your  normal  shampoo.  3.  After you shampoo, rinse your hair and body thoroughly to remove the  shampoo.                           4.  Use CHG as you would any other liquid soap.  You can apply chg directly  to the skin and wash                       Gently with a scrungie or clean washcloth.  5.  Apply the CHG Soap to your body ONLY FROM THE NECK DOWN.   Do not use on face/ open                           Wound or open sores. Avoid contact with eyes, ears mouth and genitals (private parts).  Wash face,  Genitals (private parts) with your normal soap.             6.  Wash thoroughly, paying special attention to the area where your surgery  will be performed.  7.  Thoroughly rinse your body with warm water from the neck down.  8.  DO NOT shower/wash with your normal soap after using and rinsing off  the CHG Soap.                9.  Pat yourself dry with a clean towel.            10.  Wear clean pajamas.            11.  Place clean sheets on your bed the night of your first shower and do not  sleep with pets. Day of Surgery : Do not apply any lotions/deodorants the morning of surgery.  Please wear clean clothes to the hospital/surgery center.  FAILURE TO FOLLOW THESE INSTRUCTIONS MAY RESULT IN THE CANCELLATION OF YOUR SURGERY PATIENT SIGNATURE_________________________________  NURSE SIGNATURE__________________________________  ________________________________________________________________________

## 2017-12-01 ENCOUNTER — Encounter (HOSPITAL_COMMUNITY): Payer: Self-pay

## 2017-12-01 ENCOUNTER — Encounter (HOSPITAL_COMMUNITY)
Admission: RE | Admit: 2017-12-01 | Discharge: 2017-12-01 | Disposition: A | Discharge status: Home / Self Care | Payer: 59 | Source: Ambulatory Visit | Attending: Obstetrics & Gynecology | Admitting: Obstetrics & Gynecology

## 2017-12-01 ENCOUNTER — Other Ambulatory Visit: Payer: Self-pay

## 2017-12-01 DIAGNOSIS — D509 Iron deficiency anemia, unspecified: Secondary | ICD-10-CM | POA: Insufficient documentation

## 2017-12-01 DIAGNOSIS — D259 Leiomyoma of uterus, unspecified: Secondary | ICD-10-CM | POA: Insufficient documentation

## 2017-12-01 DIAGNOSIS — Z01812 Encounter for preprocedural laboratory examination: Secondary | ICD-10-CM | POA: Insufficient documentation

## 2017-12-01 HISTORY — DX: Other specified postprocedural states: Z98.890

## 2017-12-01 HISTORY — DX: Other specified postprocedural states: R11.2

## 2017-12-01 LAB — CBC
HCT: 41.1 % (ref 36.0–46.0)
Hemoglobin: 13.4 g/dL (ref 12.0–15.0)
MCH: 29.2 pg (ref 26.0–34.0)
MCHC: 32.6 g/dL (ref 30.0–36.0)
MCV: 89.5 fL (ref 78.0–100.0)
PLATELETS: 259 10*3/uL (ref 150–400)
RBC: 4.59 MIL/uL (ref 3.87–5.11)
RDW: 16.2 % — ABNORMAL HIGH (ref 11.5–15.5)
WBC: 6.8 10*3/uL (ref 4.0–10.5)

## 2017-12-07 ENCOUNTER — Encounter (HOSPITAL_BASED_OUTPATIENT_CLINIC_OR_DEPARTMENT_OTHER): Payer: Self-pay | Admitting: Anesthesiology

## 2017-12-07 NOTE — Anesthesia Preprocedure Evaluation (Addendum)
Anesthesia Evaluation  Patient identified by MRN, date of birth, ID band  Reviewed: Allergy & Precautions, NPO status   History of Anesthesia Complications (+) PONV and history of anesthetic complications  Airway Mallampati: I       Dental no notable dental hx. (+) Teeth Intact   Pulmonary asthma ,    Pulmonary exam normal breath sounds clear to auscultation       Cardiovascular negative cardio ROS Normal cardiovascular exam Rhythm:Regular Rate:Normal     Neuro/Psych  Headaches, negative psych ROS   GI/Hepatic negative GI ROS, Neg liver ROS,   Endo/Other  negative endocrine ROS  Renal/GU negative Renal ROS  negative genitourinary   Musculoskeletal negative musculoskeletal ROS (+)   Abdominal Normal abdominal exam  (+)   Peds  Hematology negative hematology ROS (+)   Anesthesia Other Findings   Reproductive/Obstetrics negative OB ROS                            Anesthesia Physical Anesthesia Plan  ASA: II  Anesthesia Plan: General   Post-op Pain Management:    Induction: Intravenous  PONV Risk Score and Plan: 4 or greater and Ondansetron, Dexamethasone, Midazolam and Scopolamine patch - Pre-op  Airway Management Planned: Oral ETT  Additional Equipment:   Intra-op Plan:   Post-operative Plan: Extubation in OR  Informed Consent: I have reviewed the patients History and Physical, chart, labs and discussed the procedure including the risks, benefits and alternatives for the proposed anesthesia with the patient or authorized representative who has indicated his/her understanding and acceptance.   Dental advisory given  Plan Discussed with: CRNA and Surgeon  Anesthesia Plan Comments:        Anesthesia Quick Evaluation

## 2017-12-08 ENCOUNTER — Ambulatory Visit (HOSPITAL_BASED_OUTPATIENT_CLINIC_OR_DEPARTMENT_OTHER)
Admission: RE | Admit: 2017-12-08 | Discharge: 2017-12-09 | Disposition: A | Discharge status: Home / Self Care | Payer: 59 | Source: Ambulatory Visit | Attending: Obstetrics & Gynecology | Admitting: Obstetrics & Gynecology

## 2017-12-08 ENCOUNTER — Encounter: Payer: Self-pay | Admitting: Obstetrics & Gynecology

## 2017-12-08 ENCOUNTER — Other Ambulatory Visit: Payer: Self-pay

## 2017-12-08 ENCOUNTER — Encounter (HOSPITAL_BASED_OUTPATIENT_CLINIC_OR_DEPARTMENT_OTHER): Payer: Self-pay | Admitting: *Deleted

## 2017-12-08 ENCOUNTER — Ambulatory Visit (HOSPITAL_BASED_OUTPATIENT_CLINIC_OR_DEPARTMENT_OTHER): Payer: 59 | Admitting: Anesthesiology

## 2017-12-08 ENCOUNTER — Other Ambulatory Visit: Payer: Self-pay | Admitting: Obstetrics & Gynecology

## 2017-12-08 ENCOUNTER — Encounter (HOSPITAL_BASED_OUTPATIENT_CLINIC_OR_DEPARTMENT_OTHER)
Admission: RE | Disposition: A | Discharge status: Home / Self Care | Payer: Self-pay | Source: Ambulatory Visit | Attending: Obstetrics & Gynecology

## 2017-12-08 DIAGNOSIS — Z833 Family history of diabetes mellitus: Secondary | ICD-10-CM | POA: Insufficient documentation

## 2017-12-08 DIAGNOSIS — Z79899 Other long term (current) drug therapy: Secondary | ICD-10-CM | POA: Diagnosis not present

## 2017-12-08 DIAGNOSIS — Z7951 Long term (current) use of inhaled steroids: Secondary | ICD-10-CM | POA: Insufficient documentation

## 2017-12-08 DIAGNOSIS — N888 Other specified noninflammatory disorders of cervix uteri: Secondary | ICD-10-CM | POA: Insufficient documentation

## 2017-12-08 DIAGNOSIS — D509 Iron deficiency anemia, unspecified: Secondary | ICD-10-CM | POA: Insufficient documentation

## 2017-12-08 DIAGNOSIS — Z8249 Family history of ischemic heart disease and other diseases of the circulatory system: Secondary | ICD-10-CM | POA: Insufficient documentation

## 2017-12-08 DIAGNOSIS — Z91013 Allergy to seafood: Secondary | ICD-10-CM | POA: Insufficient documentation

## 2017-12-08 DIAGNOSIS — N921 Excessive and frequent menstruation with irregular cycle: Secondary | ICD-10-CM

## 2017-12-08 DIAGNOSIS — Z8262 Family history of osteoporosis: Secondary | ICD-10-CM | POA: Insufficient documentation

## 2017-12-08 DIAGNOSIS — N920 Excessive and frequent menstruation with regular cycle: Secondary | ICD-10-CM | POA: Diagnosis present

## 2017-12-08 DIAGNOSIS — J45909 Unspecified asthma, uncomplicated: Secondary | ICD-10-CM | POA: Insufficient documentation

## 2017-12-08 DIAGNOSIS — Z886 Allergy status to analgesic agent status: Secondary | ICD-10-CM | POA: Insufficient documentation

## 2017-12-08 DIAGNOSIS — G43109 Migraine with aura, not intractable, without status migrainosus: Secondary | ICD-10-CM | POA: Insufficient documentation

## 2017-12-08 DIAGNOSIS — D259 Leiomyoma of uterus, unspecified: Secondary | ICD-10-CM | POA: Insufficient documentation

## 2017-12-08 DIAGNOSIS — N800 Endometriosis of uterus: Secondary | ICD-10-CM

## 2017-12-08 HISTORY — PX: TOTAL LAPAROSCOPIC HYSTERECTOMY WITH SALPINGECTOMY: SHX6742

## 2017-12-08 HISTORY — PX: CYSTOSCOPY: SHX5120

## 2017-12-08 LAB — HEMOGLOBIN: Hemoglobin: 12.3 g/dL (ref 12.0–15.0)

## 2017-12-08 LAB — POCT PREGNANCY, URINE: PREG TEST UR: NEGATIVE

## 2017-12-08 SURGERY — HYSTERECTOMY, TOTAL, LAPAROSCOPIC, WITH SALPINGECTOMY
Anesthesia: General | Site: Bladder

## 2017-12-08 MED ORDER — SUGAMMADEX SODIUM 200 MG/2ML IV SOLN
INTRAVENOUS | Status: AC
  Filled 2017-12-08: qty 2

## 2017-12-08 MED ORDER — ONDANSETRON HCL 4 MG/2ML IJ SOLN
INTRAMUSCULAR | Status: AC
  Filled 2017-12-08: qty 2

## 2017-12-08 MED ORDER — MONTELUKAST SODIUM 10 MG PO TABS
10.0000 mg | ORAL_TABLET | Freq: Every day | ORAL | Status: DC
  Administered 2017-12-08: 10 mg via ORAL
  Filled 2017-12-08: qty 1

## 2017-12-08 MED ORDER — OXYCODONE-ACETAMINOPHEN 5-325 MG PO TABS
ORAL_TABLET | ORAL | Status: AC
  Filled 2017-12-08: qty 1

## 2017-12-08 MED ORDER — ENOXAPARIN SODIUM 40 MG/0.4ML ~~LOC~~ SOLN
SUBCUTANEOUS | Status: AC
  Filled 2017-12-08: qty 0.4

## 2017-12-08 MED ORDER — KETOROLAC TROMETHAMINE 30 MG/ML IJ SOLN
INTRAMUSCULAR | Status: AC
  Filled 2017-12-08: qty 1

## 2017-12-08 MED ORDER — ACETAMINOPHEN 10 MG/ML IV SOLN
1000.0000 mg | Freq: Once | INTRAVENOUS | Status: DC | PRN
  Filled 2017-12-08: qty 100

## 2017-12-08 MED ORDER — ENOXAPARIN SODIUM 40 MG/0.4ML ~~LOC~~ SOLN
40.0000 mg | SUBCUTANEOUS | Status: AC
  Administered 2017-12-08: 40 mg via SUBCUTANEOUS
  Filled 2017-12-08: qty 0.4

## 2017-12-08 MED ORDER — LACTATED RINGERS IV SOLN
INTRAVENOUS | Status: DC
  Administered 2017-12-08 (×2): via INTRAVENOUS
  Filled 2017-12-08 (×2): qty 1000

## 2017-12-08 MED ORDER — SCOPOLAMINE 1 MG/3DAYS TD PT72
MEDICATED_PATCH | TRANSDERMAL | Status: AC
  Filled 2017-12-08: qty 1

## 2017-12-08 MED ORDER — FENTANYL CITRATE (PF) 100 MCG/2ML IJ SOLN
INTRAMUSCULAR | Status: DC | PRN
  Administered 2017-12-08: 50 ug via INTRAVENOUS
  Administered 2017-12-08: 100 ug via INTRAVENOUS

## 2017-12-08 MED ORDER — MIDAZOLAM HCL 5 MG/5ML IJ SOLN
INTRAMUSCULAR | Status: DC | PRN
  Administered 2017-12-08: 2 mg via INTRAVENOUS

## 2017-12-08 MED ORDER — PROPOFOL 10 MG/ML IV BOLUS
INTRAVENOUS | Status: AC
  Filled 2017-12-08: qty 40

## 2017-12-08 MED ORDER — DEXTROSE-NACL 5-0.45 % IV SOLN
INTRAVENOUS | Status: DC
  Administered 2017-12-08: 20:00:00 via INTRAVENOUS
  Filled 2017-12-08 (×2): qty 1000

## 2017-12-08 MED ORDER — OXYCODONE-ACETAMINOPHEN 5-325 MG PO TABS
1.0000 | ORAL_TABLET | ORAL | Status: DC | PRN
  Administered 2017-12-08 (×3): 1 via ORAL
  Filled 2017-12-08: qty 2

## 2017-12-08 MED ORDER — ACETAMINOPHEN 325 MG PO TABS
650.0000 mg | ORAL_TABLET | ORAL | Status: DC | PRN
  Filled 2017-12-08: qty 2

## 2017-12-08 MED ORDER — PROMETHAZINE HCL 25 MG/ML IJ SOLN
6.2500 mg | INTRAMUSCULAR | Status: DC | PRN
  Filled 2017-12-08: qty 1

## 2017-12-08 MED ORDER — FAMOTIDINE IN NACL 20-0.9 MG/50ML-% IV SOLN
20.0000 mg | Freq: Two times a day (BID) | INTRAVENOUS | Status: DC
  Administered 2017-12-08 (×2): 20 mg via INTRAVENOUS
  Filled 2017-12-08 (×3): qty 50

## 2017-12-08 MED ORDER — SCOPOLAMINE 1 MG/3DAYS TD PT72
1.0000 | MEDICATED_PATCH | TRANSDERMAL | Status: DC
  Administered 2017-12-08: 1.5 mg via TRANSDERMAL
  Filled 2017-12-08: qty 1

## 2017-12-08 MED ORDER — HEMOSTATIC AGENTS (NO CHARGE) OPTIME
TOPICAL | Status: DC | PRN
  Administered 2017-12-08: 1 via TOPICAL

## 2017-12-08 MED ORDER — ONDANSETRON HCL 4 MG/2ML IJ SOLN
4.0000 mg | Freq: Four times a day (QID) | INTRAMUSCULAR | Status: DC | PRN
  Administered 2017-12-08: 4 mg via INTRAVENOUS
  Filled 2017-12-08: qty 2

## 2017-12-08 MED ORDER — FENTANYL CITRATE (PF) 100 MCG/2ML IJ SOLN
25.0000 ug | INTRAMUSCULAR | Status: DC | PRN
  Administered 2017-12-08: 25 ug via INTRAVENOUS
  Administered 2017-12-08: 50 ug via INTRAVENOUS
  Administered 2017-12-08: 25 ug via INTRAVENOUS
  Filled 2017-12-08: qty 1

## 2017-12-08 MED ORDER — ELETRIPTAN HYDROBROMIDE 40 MG PO TABS
40.0000 mg | ORAL_TABLET | Freq: Once | ORAL | Status: AC
  Administered 2017-12-08: 40 mg via ORAL
  Filled 2017-12-08 (×2): qty 1

## 2017-12-08 MED ORDER — FENTANYL CITRATE (PF) 100 MCG/2ML IJ SOLN
INTRAMUSCULAR | Status: AC
Start: 2017-12-08 — End: ?
  Filled 2017-12-08: qty 2

## 2017-12-08 MED ORDER — SODIUM CHLORIDE 0.9 % IR SOLN
Status: DC | PRN
  Administered 2017-12-08: 3000 mL

## 2017-12-08 MED ORDER — ONDANSETRON HCL 4 MG/2ML IJ SOLN
INTRAMUSCULAR | Status: DC | PRN
  Administered 2017-12-08: 4 mg via INTRAVENOUS

## 2017-12-08 MED ORDER — SODIUM CHLORIDE 0.9 % IV SOLN
INTRAVENOUS | Status: AC
  Filled 2017-12-08: qty 2

## 2017-12-08 MED ORDER — OXYCODONE HCL 5 MG PO TABS
5.0000 mg | ORAL_TABLET | Freq: Once | ORAL | Status: DC | PRN
  Filled 2017-12-08: qty 1

## 2017-12-08 MED ORDER — SUGAMMADEX SODIUM 200 MG/2ML IV SOLN
INTRAVENOUS | Status: DC | PRN
  Administered 2017-12-08: 200 mg via INTRAVENOUS

## 2017-12-08 MED ORDER — TOPIRAMATE 100 MG PO TABS
100.0000 mg | ORAL_TABLET | Freq: Every day | ORAL | Status: DC
  Administered 2017-12-08: 100 mg via ORAL
  Filled 2017-12-08: qty 1

## 2017-12-08 MED ORDER — ALBUTEROL SULFATE HFA 108 (90 BASE) MCG/ACT IN AERS
INHALATION_SPRAY | RESPIRATORY_TRACT | Status: DC | PRN
  Administered 2017-12-08: 2 via RESPIRATORY_TRACT

## 2017-12-08 MED ORDER — CEFOTETAN DISODIUM 2 G IJ SOLR
2.0000 g | INTRAMUSCULAR | Status: AC
  Administered 2017-12-08: 2 g via INTRAVENOUS
  Filled 2017-12-08: qty 2

## 2017-12-08 MED ORDER — DEXAMETHASONE SODIUM PHOSPHATE 10 MG/ML IJ SOLN
INTRAMUSCULAR | Status: AC
  Filled 2017-12-08: qty 1

## 2017-12-08 MED ORDER — FENTANYL CITRATE (PF) 250 MCG/5ML IJ SOLN
INTRAMUSCULAR | Status: AC
  Filled 2017-12-08: qty 5

## 2017-12-08 MED ORDER — ACETAMINOPHEN 160 MG/5ML PO SOLN
325.0000 mg | ORAL | Status: DC | PRN
  Filled 2017-12-08: qty 20.3

## 2017-12-08 MED ORDER — ENOXAPARIN SODIUM 40 MG/0.4ML ~~LOC~~ SOLN
40.0000 mg | SUBCUTANEOUS | Status: DC
  Filled 2017-12-08: qty 0.4

## 2017-12-08 MED ORDER — MIDAZOLAM HCL 2 MG/2ML IJ SOLN
INTRAMUSCULAR | Status: AC
  Filled 2017-12-08: qty 2

## 2017-12-08 MED ORDER — MORPHINE SULFATE (PF) 2 MG/ML IV SOLN
1.0000 mg | INTRAVENOUS | Status: DC | PRN
  Administered 2017-12-08: 2 mg via INTRAVENOUS
  Filled 2017-12-08: qty 1

## 2017-12-08 MED ORDER — BACLOFEN 10 MG PO TABS
10.0000 mg | ORAL_TABLET | Freq: Three times a day (TID) | ORAL | Status: DC | PRN
  Administered 2017-12-08: 10 mg via ORAL
  Filled 2017-12-08: qty 1

## 2017-12-08 MED ORDER — MENTHOL 3 MG MT LOZG
1.0000 | LOZENGE | OROMUCOSAL | Status: DC | PRN
  Filled 2017-12-08: qty 9

## 2017-12-08 MED ORDER — PROPOFOL 10 MG/ML IV BOLUS
INTRAVENOUS | Status: DC | PRN
  Administered 2017-12-08: 160 mg via INTRAVENOUS

## 2017-12-08 MED ORDER — OXYCODONE-ACETAMINOPHEN 5-325 MG PO TABS: 1.0000 | ORAL_TABLET | ORAL | 0 refills | Status: AC | PRN

## 2017-12-08 MED ORDER — ALUM & MAG HYDROXIDE-SIMETH 200-200-20 MG/5ML PO SUSP
30.0000 mL | ORAL | Status: DC | PRN
  Filled 2017-12-08: qty 30

## 2017-12-08 MED ORDER — SIMETHICONE 80 MG PO CHEW
80.0000 mg | CHEWABLE_TABLET | Freq: Four times a day (QID) | ORAL | Status: DC | PRN
  Filled 2017-12-08: qty 1

## 2017-12-08 MED ORDER — SODIUM CHLORIDE 0.9 % IV SOLN
INTRAVENOUS | Status: DC | PRN
  Administered 2017-12-08: 60 mL

## 2017-12-08 MED ORDER — OXYCODONE HCL 5 MG/5ML PO SOLN
5.0000 mg | Freq: Once | ORAL | Status: DC | PRN
  Filled 2017-12-08: qty 5

## 2017-12-08 MED ORDER — MEPERIDINE HCL 25 MG/ML IJ SOLN
6.2500 mg | INTRAMUSCULAR | Status: DC | PRN
  Filled 2017-12-08: qty 1

## 2017-12-08 MED ORDER — LIDOCAINE 2% (20 MG/ML) 5 ML SYRINGE
INTRAMUSCULAR | Status: AC
  Filled 2017-12-08: qty 5

## 2017-12-08 MED ORDER — ROCURONIUM BROMIDE 50 MG/5ML IV SOSY
PREFILLED_SYRINGE | INTRAVENOUS | Status: DC | PRN
  Administered 2017-12-08: 50 mg via INTRAVENOUS
  Administered 2017-12-08 (×2): 10 mg via INTRAVENOUS

## 2017-12-08 MED ORDER — PROMETHAZINE HCL 25 MG/ML IJ SOLN
12.5000 mg | Freq: Four times a day (QID) | INTRAMUSCULAR | Status: DC | PRN
  Filled 2017-12-08: qty 1

## 2017-12-08 MED ORDER — MORPHINE SULFATE (PF) 2 MG/ML IV SOLN
INTRAVENOUS | Status: AC
  Filled 2017-12-08: qty 1

## 2017-12-08 MED ORDER — ACETAMINOPHEN 325 MG PO TABS
325.0000 mg | ORAL_TABLET | ORAL | Status: DC | PRN
  Filled 2017-12-08: qty 2

## 2017-12-08 MED ORDER — ROCURONIUM BROMIDE 100 MG/10ML IV SOLN
INTRAVENOUS | Status: AC
  Filled 2017-12-08: qty 1

## 2017-12-08 MED ORDER — DEXAMETHASONE SODIUM PHOSPHATE 10 MG/ML IJ SOLN
INTRAMUSCULAR | Status: DC | PRN
  Administered 2017-12-08: 10 mg via INTRAVENOUS

## 2017-12-08 MED ORDER — SIMETHICONE 80 MG PO CHEW
CHEWABLE_TABLET | ORAL | Status: AC
  Filled 2017-12-08: qty 1

## 2017-12-08 MED ORDER — BUPIVACAINE HCL (PF) 0.25 % IJ SOLN
INTRAMUSCULAR | Status: DC | PRN
  Administered 2017-12-08: 10 mL

## 2017-12-08 MED ORDER — ALBUTEROL SULFATE HFA 108 (90 BASE) MCG/ACT IN AERS
2.0000 | INHALATION_SPRAY | RESPIRATORY_TRACT | Status: DC | PRN
  Administered 2017-12-08 – 2017-12-09 (×4): 2 via RESPIRATORY_TRACT
  Filled 2017-12-08: qty 6.7

## 2017-12-08 MED ORDER — LIDOCAINE 2% (20 MG/ML) 5 ML SYRINGE
INTRAMUSCULAR | Status: DC | PRN
  Administered 2017-12-08: 100 mg via INTRAVENOUS

## 2017-12-08 SURGICAL SUPPLY — 60 items
APPLICATOR ARISTA FLEXITIP XL (MISCELLANEOUS) ×4 IMPLANT
BLADE SURG 10 STRL SS (BLADE) ×4 IMPLANT
CABLE HIGH FREQUENCY MONO STRZ (ELECTRODE) IMPLANT
COVER BACK TABLE 80X110 HD (DRAPES) ×4 IMPLANT
COVER MAYO STAND STRL (DRAPES) ×4 IMPLANT
COVER SURGICAL LIGHT HANDLE (MISCELLANEOUS) IMPLANT
DERMABOND ADVANCED (GAUZE/BANDAGES/DRESSINGS) ×2
DERMABOND ADVANCED .7 DNX12 (GAUZE/BANDAGES/DRESSINGS) ×2 IMPLANT
DILATOR CANAL MILEX (MISCELLANEOUS) IMPLANT
DRSG COVADERM PLUS 2X2 (GAUZE/BANDAGES/DRESSINGS) ×12 IMPLANT
DRSG OPSITE POSTOP 3X4 (GAUZE/BANDAGES/DRESSINGS) ×4 IMPLANT
DURAPREP 26ML APPLICATOR (WOUND CARE) ×4 IMPLANT
GLOVE BIO SURGEON STRL SZ 6.5 (GLOVE) ×3 IMPLANT
GLOVE BIO SURGEONS STRL SZ 6.5 (GLOVE) ×1
GLOVE BIOGEL PI IND STRL 6.5 (GLOVE) ×2 IMPLANT
GLOVE BIOGEL PI IND STRL 7.0 (GLOVE) ×4 IMPLANT
GLOVE BIOGEL PI IND STRL 7.5 (GLOVE) ×8 IMPLANT
GLOVE BIOGEL PI INDICATOR 6.5 (GLOVE) ×2
GLOVE BIOGEL PI INDICATOR 7.0 (GLOVE) ×4
GLOVE BIOGEL PI INDICATOR 7.5 (GLOVE) ×8
GLOVE ECLIPSE 6.5 STRL STRAW (GLOVE) ×8 IMPLANT
GOWN STRL REUS W/TWL LRG LVL3 (GOWN DISPOSABLE) ×8 IMPLANT
GOWN STRL REUS W/TWL XL LVL3 (GOWN DISPOSABLE) ×8 IMPLANT
HEMOSTAT ARISTA ABSORB 3G PWDR (MISCELLANEOUS) ×4 IMPLANT
IV NS IRRIG 3000ML ARTHROMATIC (IV SOLUTION) ×4 IMPLANT
LIGASURE VESSEL 5MM BLUNT TIP (ELECTROSURGICAL) ×4 IMPLANT
NEEDLE INSUFFLATION 120MM (ENDOMECHANICALS) ×4 IMPLANT
NS IRRIG 1000ML POUR BTL (IV SOLUTION) ×4 IMPLANT
OCCLUDER COLPOPNEUMO (BALLOONS) ×4 IMPLANT
PACK LAPAROSCOPY BASIN (CUSTOM PROCEDURE TRAY) ×4 IMPLANT
PACK TRENDGUARD 450 HYBRID PRO (MISCELLANEOUS) ×2 IMPLANT
POUCH LAPAROSCOPIC INSTRUMENT (MISCELLANEOUS) ×4 IMPLANT
PROTECTOR NERVE ULNAR (MISCELLANEOUS) ×8 IMPLANT
SCISSORS LAP 5X35 DISP (ENDOMECHANICALS) IMPLANT
SET IRRIG TUBING LAPAROSCOPIC (IRRIGATION / IRRIGATOR) ×4 IMPLANT
SET IRRIG Y TYPE TUR BLADDER L (SET/KITS/TRAYS/PACK) ×4 IMPLANT
SET TRI-LUMEN FLTR TB AIRSEAL (TUBING) ×4 IMPLANT
SHEARS HARMONIC ACE PLUS 36CM (ENDOMECHANICALS) ×4 IMPLANT
SOLUTION ELECTROLUBE (MISCELLANEOUS) IMPLANT
SUT VIC AB 0 CT1 27 (SUTURE) ×4
SUT VIC AB 0 CT1 27XBRD ANBCTR (SUTURE) ×4 IMPLANT
SUT VICRYL 0 UR6 27IN ABS (SUTURE) IMPLANT
SUT VICRYL 4-0 PS2 18IN ABS (SUTURE) ×4 IMPLANT
SUT VLOC 180 0 9IN  GS21 (SUTURE) ×2
SUT VLOC 180 0 9IN GS21 (SUTURE) ×2 IMPLANT
SYR 10ML LL (SYRINGE) ×4 IMPLANT
SYR 50ML LL SCALE MARK (SYRINGE) ×8 IMPLANT
SYSTEM CARTER THOMASON II (TROCAR) IMPLANT
TIP UTERINE 5.1X6CM LAV DISP (MISCELLANEOUS) IMPLANT
TIP UTERINE 6.7X10CM GRN DISP (MISCELLANEOUS) ×4 IMPLANT
TIP UTERINE 6.7X6CM WHT DISP (MISCELLANEOUS) IMPLANT
TIP UTERINE 6.7X8CM BLUE DISP (MISCELLANEOUS) ×4 IMPLANT
TOWEL OR 17X24 6PK STRL BLUE (TOWEL DISPOSABLE) ×8 IMPLANT
TRAY FOLEY W/BAG SLVR 14FR (SET/KITS/TRAYS/PACK) ×4 IMPLANT
TRENDGUARD 450 HYBRID PRO PACK (MISCELLANEOUS) ×4
TROCAR ADV FIXATION 5X100MM (TROCAR) ×4 IMPLANT
TROCAR PORT AIRSEAL 5X120 (TROCAR) ×4 IMPLANT
TROCAR XCEL NON BLADE 8MM B8LT (ENDOMECHANICALS) ×4 IMPLANT
TROCAR XCEL NON-BLD 5MMX100MML (ENDOMECHANICALS) ×4 IMPLANT
WARMER LAPAROSCOPE (MISCELLANEOUS) ×4 IMPLANT

## 2017-12-08 NOTE — Anesthesia Procedure Notes (Signed)
Procedure Name: Intubation Date/Time: 12/08/2017 7:33 AM Performed by: Bonney Aid, CRNA Pre-anesthesia Checklist: Patient identified, Emergency Drugs available, Suction available and Patient being monitored Patient Re-evaluated:Patient Re-evaluated prior to induction Oxygen Delivery Method: Circle system utilized Preoxygenation: Pre-oxygenation with 100% oxygen Induction Type: IV induction Ventilation: Mask ventilation without difficulty Laryngoscope Size: Mac and 3 Grade View: Grade I Tube type: Oral Tube size: 7.5 mm Number of attempts: 1 Airway Equipment and Method: Stylet Placement Confirmation: ETT inserted through vocal cords under direct vision,  positive ETCO2 and breath sounds checked- equal and bilateral Secured at: 21 cm Tube secured with: Tape Dental Injury: Teeth and Oropharynx as per pre-operative assessment

## 2017-12-08 NOTE — Transfer of Care (Signed)
Immediate Anesthesia Transfer of Care Note  Patient: Stacy Ochoa  Procedure(s) Performed: TOTAL LAPAROSCOPIC HYSTERECTOMY WITH SALPINGECTOMY (Bilateral Abdomen) CYSTOSCOPY (N/A Bladder)  Patient Location: PACU  Anesthesia Type:General  Level of Consciousness: sedated and responds to stimulation  Airway & Oxygen Therapy: Patient Spontanous Breathing and Patient connected to nasal cannula oxygen  Post-op Assessment: Report given to RN  Post vital signs: Reviewed and stable  Last Vitals:  Vitals Value Taken Time  BP 122/75 12/08/2017  9:46 AM  Temp    Pulse 98 12/08/2017  9:48 AM  Resp 12 12/08/2017  9:48 AM  SpO2 100 % 12/08/2017  9:48 AM  Vitals shown include unvalidated device data.  Last Pain:  Vitals:   12/08/17 0549  TempSrc: Oral         Complications: No apparent anesthesia complications

## 2017-12-08 NOTE — H&P (Signed)
42 y.o. [id number] MarriedHispanic female here for definitive treatment of iron deficiency anemia due to menorrhagia and fibroids.  She's undergone iron transfusions earlier this year and is feeling much better from a fatigue standpoint.  Bleeding continues to be heavy with her menstrual cycle and she is ready to proceed with hysterectomy.  Hemoglobin  to discuss iron deficiency anemia and proceed with surgical planning.  She has decided she would like to proceed with hysterectomy.  Reports she is feeling much better after having two iron transfusions.  Has seen Laverna Peace, [place], hematology.  Just had blood work done last Friday.  Is going to check about having one additional iron transfusion.  Hemoglobin was in the 10 range in May prior to the iron transfusions.    She has been counseled about risks and benefits and has considered alternatives including [place], endometrial ablation, and hormonal management but had decided to proceed with definitive treatment.  Pre op endometrial biopsy was negative for abnormal cells.    Ultrasound performed 09/09/17.  Results showed:  UTERUS:10 x 5 x 5cm with 5.2 x 4.1cm and 4.2 x 3.0 cm fibroids that appear pedunculated at the left fundal region as well as a 1 x 1cm intramural fibroid. EMS:8.13mm ADNEXA: Left ovary:2.2 x 1.6 x 1.5cm Right ovary: 2.5 x 2.0 x 1.5cm CUL DE SAC:no free fluid  Pertinent Gynecological History: Menses: regular but heavy Contraception: vasectomy DES exposure: denies Blood transfusions: none Sexually transmitted diseases: no past history Previous GYN Procedures: none  Last mammogram: normal Date: 6/19 Last pap: normal Date: 04/16/18 OB History: G2, P2   Past Medical History:  Diagnosis Date  . Anemia 2019   due to heavy vag bleeding   . Asthma   . Complication of anesthesia   . Migraine with aura    gets ~ 1-2 a month - controlled with medication   . PONV (postoperative nausea and vomiting)      Past Surgical History:  Procedure Laterality Date  . BREAST REDUCTION SURGERY  1994    Family History  Problem Relation Age of Onset  . Hypertension Mother   . Diabetes Mother   . Heart disease Maternal Grandfather   . Osteoporosis Maternal Aunt     Social History:  reports that she has never smoked. She has never used smokeless tobacco. She reports that she does not drink alcohol or use drugs.  Allergies:  Allergies  Allergen Reactions  . Aspirin Anaphylaxis  . Shellfish Allergy Anaphylaxis    Medications Prior to Admission  Medication Sig Dispense Refill Last Dose  . albuterol (PROVENTIL HFA) 108 (90 Base) MCG/ACT inhaler Inhale into the lungs daily as needed.   Taking  . albuterol (PROVENTIL) (2.5 MG/3ML) 0.083% nebulizer solution Take 2.5 mg by nebulization every 4 (four) hours as needed for wheezing or shortness of breath.    Taking  . baclofen (LIORESAL) 10 MG tablet Take 1-2 tablets by mouth 3 (three) times daily as needed (migraines).    Taking  . eletriptan (RELPAX) 40 MG tablet Take 1 tablet by mouth every 2 (two) hours as needed for migraine.    Taking  . EMGALITY 120 MG/ML SOAJ INJECT 120 MG SQ ONCE MONTHLY  10 Taking  . EPINEPHrine (EPIPEN 2-PAK) 0.3 mg/0.3 mL IJ SOAJ injection Inject 0.3 mg into the muscle once as needed (anaphylaxis).    Not Taking  . ergocalciferol (VITAMIN D2) 50000 units capsule Take 1 capsule (50,000 Units total) by mouth once a week. 8 capsule 6 Taking  .  fluticasone (FLONASE) 50 MCG/ACT nasal spray Place 2 sprays into both nostrils 2 (two) times daily.    Taking  . montelukast (SINGULAIR) 10 MG tablet Take 10 mg by mouth at bedtime.    Taking  . Multiple Vitamin (MULTIVITAMIN) tablet Take 1 tablet by mouth daily.   Taking  . topiramate (TOPAMAX) 100 MG tablet Take 100 mg by mouth daily.  0 Taking    Review of Systems  All other systems reviewed and are negative.   Blood pressure 121/77, pulse 74, temperature 98.4 F (36.9 C),  temperature source Oral, resp. rate 16, weight 82.1 kg, SpO2 100 %. Physical Exam  Constitutional: She is oriented to person, place, and time. She appears well-developed and well-nourished.  Cardiovascular: Normal rate and regular rhythm.  Respiratory: Effort normal and breath sounds normal.  Neurological: She is alert and oriented to person, place, and time.  Skin: Skin is warm and dry.  Psychiatric: She has a normal mood and affect.    Results for orders placed or performed during the hospital encounter of 12/08/17 (from the past 24 hour(s))  Pregnancy, urine POC     Status: None   Collection Time: 12/08/17  6:16 AM  Result Value Ref Range   Preg Test, Ur NEGATIVE NEGATIVE    No results found.  Assessment/Plan: 42 yo G2P2 MWF with hx of menorrhagia, iron deficiency anemia requiring iron transfusions, uterine fibroids here for definitve treatment with TLH/bilateral salpingectomy, possible BSO, cystoscopy.  Megan Salon 12/08/2017, 7:11 AM

## 2017-12-08 NOTE — Op Note (Signed)
12/08/2017  9:53 AM  PATIENT:  Stacy Ochoa  42 y.o. female  PRE-OPERATIVE DIAGNOSIS:  menorrhagia, pedunculated fibroid, anemia, iron deficiency  POST-OPERATIVE DIAGNOSIS:  menorrhagia, pedunculated fibroid, anemia, iron deficiency  PROCEDURE:  Procedure(s): TOTAL LAPAROSCOPIC HYSTERECTOMY WITH SALPINGECTOMY CYSTOSCOPY  SURGEON:  Megan Salon  ASSISTANTS: Sumner Boast, MD   ANESTHESIA:   general  ESTIMATED BLOOD LOSS: 50 mL  BLOOD ADMINISTERED:none   FLUIDS: 800cc LR  UOP: 150cc clear UOP  SPECIMEN:  Uterus with two pedunculated fibroids, cervix and fallopian tubes  DISPOSITION OF SPECIMEN:  PATHOLOGY  FINDINGS: two large 5cm (+) fibroids coming from the left fundal region of the uterus.  Uterus enlarged to about 12 weeks but was mobile.  Upper abdomen was normal.  Appendix was normal.  DESCRIPTION OF OPERATION: Patient is taken to the operating room. She is placed in the supine position. She is a running IV in place. Informed consent was present on the chart. SCDs on her lower extremities and functioning properly. Patient was positioned while she was awake.  Her legs were placed in the low lithotomy position in [place]. Her arms were tucked by the side.  General endotracheal anesthesia was administered by the anesthesia staff without difficulty. Dr. Jillyn Hidden, anesthesia, oversaw case.  Time out performed.    Chlora prep was then used to prep the abdomen and technicare was used to prep the inner thighs, perineum and vagina. Once 3 minutes had past the patient was draped in a normal standard fashion. The legs were lifted to the high lithotomy position. The cervix was visualized by placing a heavy weighted speculum in the posterior aspect of the vagina and using a curved Deaver retractor to the retract anteriorly. The anterior lip of the cervix was grasped with single-tooth tenaculum.  The cervix sounded to 9 cm. Pratt dilators were used to dilate the cervix up to a #21. A  RUMI uterine manipulator was obtained. A #8 disposable tip was placed on the RUMI manipulator as well as a 3.5, silver KOH ring. This was passed through the cervix and the bulb of the disposable tip was inflated with 10 cc of normal saline. There was a good fit of the KOH ring around the cervix. The tenaculum was removed. There is also good manipulation of the uterus. The speculum and retractor were removed as well. A Foley catheter was placed to straight drain.  Clear urine was noted. Legs were lowered to the low lithotomy position and attention was turned the abdomen.  The umbilicus was everted.  A Veress needle was obtained. Syringe of sterile saline was placed on a open Veress needle.  This was passed into the umbilicus until just when the fluid started to drip.  Then low flow CO2 gas was attached the needle and the pneumoperitoneum was achieved without difficulty. Once four liters of gas was in the abdomen the Veress needle was removed and a 5 millimeter non-bladed Optiview trocar and port were passed directly to the abdomen. The laparoscope was then used to confirm intraperitoneal placement. A large uterus with fundal fibroid was noted.  There was some endometriosis appearing tissue on the back of the cervix.  Ovaries were normal.  Are where prior tubal reanastomosis had been performed was seen and looks really nice, without any adhesions.  Locations for RLQ, LLQ, and suprapubic ports were noted by transillumination of the abdominal wall.  0.25% marcaine was used to anesthetize the skin.  72mm skin incision was made in the RLQ and an  AirSeal port was placed underdirect visualization of the laparoscope.  Then a 55mm skin incision was made and a 60mm nonbladed trochar and port was placed in the LLQ.  Finally, and 89mm skin incision was made about 4cm above the pubic symphasis and an 9mm non-bladed port was placed with direct visualization of the laparoscope.  All trochars were removed.    Ureters were  identifies.  Attention was turned to the right side. With uterus on stretch the left tube was excised off the ovary and mesosalpinx was dissected to free the tube. Then the right utero-ovarian pedicle was serially clamped cauterized and incised using the ligasure device. Right round ligament was serially clamped cauterized and incised. The anterior and posterior peritoneum of the inferior leaf of the broad ligament were opened. The beginning of the baldde flap was created.  The bladder was taken down below the level of the KOH ring. The right uterine artery skeletonized and then just superior to the KOH ring this vessel was serially clamped and cauterized.    Attention was turned the left side.  The uterus was placed on stretch to the opposite side.  The tube was excised off the ovary using sharp dissection a bipolar cautery.  The mesosalpinx was incised freeing the tube. Then the left uterine ovarian pedicle was serially clamped cauterized and incised. Next the left round ligament was serially clamped cauterized and incised. The anterior posterior peritoneum of the inferiorly for the broad ligament were opened. The anterior peritoneum was carried across to the dissection on the left side. The remainder of the bladder flap was created using sharp dissection. The bladder was well below the level of the KOH ring. The left uterine artery skeletonized. Then the left uterine artery, above the level of the KOH ring, was serially clamped cauterized and incised. The uterus was devascularized at this point.  Attention was turned to the right side and then the uterine artery was incised as well.  The colpotomy was performed a starting in the midline and using a harmonic scalpel with the inferior edge of the open blade  This was carried around a circumferential fashion until the vaginal mucosa was completely incised in the specimen was freed.  The specimen was then delivered to the vagina.  A vaginal occlusive device was  used to maintain the pneumoperitoneum  Instruments were changed with a needle driver and Kobra graspers.  Using a 9 inch V. lock suture, the cuff was closed by incorporating the anterior and posterior vaginal mucosa in each stitch. This was carried across all the way to the left corner and a running fashion. Two stitches were brought back towards the midline and the suture was cut flush with the vagina. The needle was brought out the pelvis. The pelvis was irrigated. All pedicles were inspected. No bleeding was noted. Ureters were noted deep in the pelvis to be peristalsing.  At this point the procedure was completed. Pneumoperitoneum pressures were lowered to ensure there was no bleeding.  Arista was placed along the cuff.  Suprapubic port was removed.  Instruments were removed.  The remaining ports were removed under direct visualization of the laparoscope and the pneumoperitoneum was relieved.  The patient was taken out of Trendelenburg positioning.  Several deep breaths were given to the patient's trying to any gas the abdomen and finally the midline port was removed.  The skin was then closed with subcuticular stitches of 3-0 Vicryl. The skin was cleansed Dermabond was applied. Attention was then turned  the vagina and the cuff was inspected. No bleeding was noted. The anterior posterior vaginal mucosa was incorporated in each stitch. The Foley catheter was removed.  Cystoscopy was performed.  No sutures or bladder injuries were noted.  Bubble on the anterior bladder was noted.  Ureters were noted with normal urine jets from each one was seen.  Foley was left out after the cystoscopic fluid was drained and cystoscope removed.  Sponge, lap, needle, initially counts were correct x2. Patient tolerated the procedure very well. She was awakened from anesthesia, extubated and taken to recovery in stable condition.   COUNTS:  YES  PLAN OF CARE: Transfer to PACU

## 2017-12-08 NOTE — Progress Notes (Signed)
12/08/2017  1500 Pt. C/o severe migraine. Pt. States she takes Relpax 40 mg at home for migraines prn. Dr. Sabra Heck contacted and made aware. Verbal order received for Relpax 40 mg PO x 1. Ok to repeat dose x 1 in two hours if no relief of symptoms. Orders placed and enacted. Will continue to closely monitor patient.  Stacy Ochoa, [employer]  5:10 PM  Pt. States no relief from migraine repeat order for Relpax 40 mg PO x 1 placed per prior orders. Will continue to closely monitor patient.  Stacy Ochoa, Arville Lime

## 2017-12-08 NOTE — Progress Notes (Signed)
Day of Surgery Procedure(s) (LRB): TOTAL LAPAROSCOPIC HYSTERECTOMY WITH SALPINGECTOMY (Bilateral) CYSTOSCOPY (N/A)  Subjective: Patient reports excellent pain control except for post op migraine.  Has now taken Relpax x 2 with significant relief.  Had mild nausea earlier.  None now.  Has walked and voided x 3.  Has eaten some soup and crackers.  Husband just brought dinner from [employer].   Pt would like to go home, if possible.    Objective: I have reviewed patient's vital signs, intake and output, medications and labs. Vitals:   12/08/17 1115 12/08/17 1145 12/08/17 1245 12/08/17 1529  BP: 111/71 102/78 101/67 100/61  Pulse:    69  Resp: 12 12 12 12   Temp: 98.4 F (36.9 C) 97.6 F (36.4 C) 98.5 F (36.9 C) 98.9 F (37.2 C)  TempSrc:    Oral  SpO2: 100% 99% 100% 100%  Weight:      Height:       UOP:  500cc since surgery  General: alert, cooperative and no distress Resp: clear to auscultation bilaterally Cardio: regular rate and rhythm, S1, S2 normal, no murmur, click, rub or gallop GI: normal findings: soft, mildly tender, no distension, occ BS and incision: clean, dry and intact Extremities: extremities normal, atraumatic, no cyanosis or edema Vaginal Bleeding: none  Assessment: s/p Procedure(s) with comments: TOTAL LAPAROSCOPIC HYSTERECTOMY WITH SALPINGECTOMY (Bilateral) - specimen weight 233 g CYSTOSCOPY (N/A): stable and progressing well  Plan: Advance diet Encourage ambulation Advance to PO medication  Will see how she tolerates dinner.  If does well, will consider D/C later.  LOS: 0 days    Megan Salon 12/08/2017, 6:44 PM

## 2017-12-08 NOTE — Anesthesia Postprocedure Evaluation (Signed)
Anesthesia Post Note  Patient: Stacy Ochoa  Procedure(s) Performed: TOTAL LAPAROSCOPIC HYSTERECTOMY WITH SALPINGECTOMY (Bilateral Abdomen) CYSTOSCOPY (N/A Bladder)     Patient location during evaluation: PACU Anesthesia Type: General Level of consciousness: sedated Pain management: pain level controlled Vital Signs Assessment: post-procedure vital signs reviewed and stable Respiratory status: spontaneous breathing and respiratory function stable Cardiovascular status: stable Postop Assessment: no apparent nausea or vomiting Anesthetic complications: no    Last Vitals:  Vitals:   12/08/17 1145 12/08/17 1245  BP: 102/78 101/67  Pulse:    Resp: 12 12  Temp: 36.4 C 36.9 C  SpO2: 99% 100%    Last Pain:  Vitals:   12/08/17 1400  TempSrc:   PainSc: 5                  Chloe Bluett DANIEL

## 2017-12-09 ENCOUNTER — Encounter (HOSPITAL_BASED_OUTPATIENT_CLINIC_OR_DEPARTMENT_OTHER): Payer: Self-pay | Admitting: Obstetrics & Gynecology

## 2017-12-09 DIAGNOSIS — D259 Leiomyoma of uterus, unspecified: Secondary | ICD-10-CM | POA: Diagnosis not present

## 2017-12-09 LAB — CBC
HCT: 36.9 % (ref 36.0–46.0)
Hemoglobin: 12 g/dL (ref 12.0–15.0)
MCH: 29.6 pg (ref 26.0–34.0)
MCHC: 32.5 g/dL (ref 30.0–36.0)
MCV: 90.9 fL (ref 78.0–100.0)
Platelets: 226 10*3/uL (ref 150–400)
RBC: 4.06 MIL/uL (ref 3.87–5.11)
RDW: 15.6 % — AB (ref 11.5–15.5)
WBC: 9.2 10*3/uL (ref 4.0–10.5)

## 2017-12-09 NOTE — Discharge Summary (Signed)
Physician Discharge Summary  Patient ID: Stacy Ochoa MRN: [id number] DOB/AGE: 1975/05/27 42 y.o.  Admit date: 12/08/2017 Discharge date: 12/09/2017  Admission Diagnoses:  H/o iron deficiency anemia, fibroid uterus, menorrhagia, h/o iron transfusions  Discharge Diagnoses:  Active Problems:   * No active hospital problems. *   Discharged Condition: good  Hospital Course: Patient admitted through same day surgery.  She was taken to OR where TLH/bilateral salpingectomy/cystoscopy were performed.  Surgical findings included two large fibroids on left side of uterus.  Surgery was uneventful.  EBL 50cc.  Foley catheter was removed before leaving OR.  Patient transferred to PACU where she was stable and then to post op unit for the remainder of her hospitalization.  During her post-op recovery, her vitals and stable and she was AF.  In evening of POD#0, she was able to transition to oral pain medications and regular diet.  She had a significant post op migraine that required treatment.  Afterwards, she had significant nausea so was monitored overnight. She was also able to void on her own.  Patient seen both in the evening of POD#0 and AM of POD#1.  In the AM of POD#1, she was complaining only of right shoulder pain.  She had full and normal ROM.  This was most consistent with post op referred shoulder pain from laparoscopy.  Management of this reveiwed with pain as well as typical resolution time.  Post op hb was 12.0, decreased from 13.4, pre-operatively.  At this point, patient was voiding, walking, having excellent pain control, had no nausea, and minimal vaginal bleeding.  She was ready for D/C.   Consults: None  Significant Diagnostic Studies: labs: post op hb 12.0  Treatments: surgery: TLH/bilateral salpingectomy/cystoscopy  Discharge Exam: Blood pressure 91/63, pulse 72, temperature 98.7 F (37.1 C), temperature source Oral, resp. rate 12, height 5\' 6"  (1.676 m), weight 82.1 kg, SpO2 100  %. see progress note.  Disposition: Discharge disposition: 01-Home or Self Care       Discharge Instructions    Call MD for:   Complete by:  As directed    Heavy vaginal bleeding, like a menstrual cycle   Call MD for:  persistant nausea and vomiting   Complete by:  As directed    Call MD for:  redness, tenderness, or signs of infection (pain, swelling, redness, odor or green/yellow discharge around incision site)   Complete by:  As directed    Call MD for:  severe uncontrolled pain   Complete by:  As directed    Call MD for:  temperature >100.4   Complete by:  As directed    Diet general   Complete by:  As directed    Discharge wound care:   Complete by:  As directed    Use soap and water, only, on incisions   Driving Restrictions   Complete by:  As directed    May drive in one week   Increase activity slowly   Complete by:  As directed    Lifting restrictions   Complete by:  As directed    No heavy lifting (>15 pounds) for 4 weeks   May shower / Bathe   Complete by:  As directed    May shower on post-operative day 1.  May take a tub bath in one week.   Sexual Activity Restrictions   Complete by:  As directed    Nothing in the vagina for 8 weeks, intercourse/tampons/douching     Allergies as of 12/09/2017  Reactions   Aspirin Anaphylaxis   Shellfish Allergy Anaphylaxis      Medication List    TAKE these medications   albuterol (2.5 MG/3ML) 0.083% nebulizer solution Commonly known as:  PROVENTIL Take 2.5 mg by nebulization every 4 (four) hours as needed for wheezing or shortness of breath.   PROVENTIL HFA 108 (90 Base) MCG/ACT inhaler Generic drug:  albuterol Inhale into the lungs daily as needed.   baclofen 10 MG tablet Commonly known as:  LIORESAL Take 1-2 tablets by mouth 3 (three) times daily as needed (migraines).   EMGALITY 120 MG/ML Soaj Generic drug:  Galcanezumab-gnlm INJECT 120 MG SQ ONCE MONTHLY   EPIPEN 2-PAK 0.3 mg/0.3 mL Soaj  injection Generic drug:  EPINEPHrine Inject 0.3 mg into the muscle once as needed (anaphylaxis).   ergocalciferol 50000 units capsule Commonly known as:  VITAMIN D2 Take 1 capsule (50,000 Units total) by mouth once a week.   fluticasone 50 MCG/ACT nasal spray Commonly known as:  FLONASE Place 2 sprays into both nostrils 2 (two) times daily.   montelukast 10 MG tablet Commonly known as:  SINGULAIR Take 10 mg by mouth at bedtime.   multivitamin tablet Take 1 tablet by mouth daily.   oxyCODONE-acetaminophen 5-325 MG tablet Commonly known as:  PERCOCET/ROXICET Take 1-2 tablets by mouth every 4 (four) hours as needed for up to 5 days for moderate pain. use only as much as needed to relieve pain   RELPAX 40 MG tablet Generic drug:  eletriptan Take 1 tablet by mouth every 2 (two) hours as needed for migraine.   topiramate 100 MG tablet Commonly known as:  TOPAMAX Take 100 mg by mouth daily.            Discharge Care Instructions  (From admission, onward)         Start     Ordered   12/09/17 0000  Discharge wound care:    Comments:  Use soap and water, only, on incisions   12/09/17 0848           Signed: Jerene Bears 12/09/2017, 8:48 AM

## 2017-12-09 NOTE — Discharge Instructions (Signed)

## 2017-12-09 NOTE — Progress Notes (Signed)
1 Day Post-Op Procedure(s) (LRB): TOTAL LAPAROSCOPIC HYSTERECTOMY WITH SALPINGECTOMY (Bilateral) CYSTOSCOPY (N/A)  Subjective: Patient reports nausea has resolved.  Pain under good control.  Having some right shoulder pain.  Voiding without difficulty.  Did eat this morning.  Having some right shoulder pain.  Headache from yesterday resolved.    Objective: I have reviewed patient's vital signs, intake and output, medications and labs. Vitals:   12/09/17 0001 12/09/17 0400  BP: (!) 91/56 91/63  Pulse: 79 72  Resp: 12 12  Temp: 98.9 F (37.2 C) 98.7 F (37.1 C)  SpO2: 98% 100%    Post op hb 12.0 this morning.  General: alert, cooperative and no distress Resp: clear to auscultation bilaterally Cardio: regular rate and rhythm, S1, S2 normal, no murmur, click, rub or gallop GI: soft, non-tender; bowel sounds normal; no masses,  no organomegaly and incision: clean, dry and intact Extremities: extremities normal, atraumatic, no cyanosis or edema Vaginal Bleeding: none  Assessment: s/p Procedure(s) with comments: TOTAL LAPAROSCOPIC HYSTERECTOMY WITH SALPINGECTOMY (Bilateral) - specimen weight 233 g CYSTOSCOPY (N/A): stable and progressing well  Plan: Discharge home  LOS: 0 days    Megan Salon 12/09/2017, 8:40 AM

## 2017-12-12 ENCOUNTER — Encounter: Payer: Self-pay | Admitting: Obstetrics & Gynecology

## 2017-12-12 ENCOUNTER — Emergency Department (HOSPITAL_COMMUNITY): Payer: 59

## 2017-12-12 ENCOUNTER — Other Ambulatory Visit: Payer: Self-pay

## 2017-12-12 ENCOUNTER — Ambulatory Visit (INDEPENDENT_AMBULATORY_CARE_PROVIDER_SITE_OTHER): Payer: 59 | Admitting: Obstetrics & Gynecology

## 2017-12-12 ENCOUNTER — Telehealth: Payer: Self-pay | Admitting: Obstetrics & Gynecology

## 2017-12-12 ENCOUNTER — Emergency Department (HOSPITAL_COMMUNITY)
Admission: EM | Admit: 2017-12-12 | Discharge: 2017-12-12 | Disposition: A | Discharge status: Home / Self Care | Payer: 59 | Attending: Emergency Medicine | Admitting: Emergency Medicine

## 2017-12-12 VITALS — BP 122/88 | HR 68 | Temp 98.1°F | Resp 18 | Ht 66.0 in | Wt 176.8 lb

## 2017-12-12 DIAGNOSIS — J45909 Unspecified asthma, uncomplicated: Secondary | ICD-10-CM | POA: Insufficient documentation

## 2017-12-12 DIAGNOSIS — R5082 Postprocedural fever: Secondary | ICD-10-CM

## 2017-12-12 DIAGNOSIS — R0602 Shortness of breath: Secondary | ICD-10-CM

## 2017-12-12 DIAGNOSIS — Z79899 Other long term (current) drug therapy: Secondary | ICD-10-CM | POA: Insufficient documentation

## 2017-12-12 LAB — POCT URINALYSIS DIPSTICK
Bilirubin, UA: NEGATIVE
Glucose, UA: NEGATIVE
Ketones, UA: NEGATIVE
NITRITE UA: NEGATIVE
PH UA: 6 (ref 5.0–8.0)
Protein, UA: NEGATIVE
UROBILINOGEN UA: 0.2 U/dL

## 2017-12-12 LAB — COMPREHENSIVE METABOLIC PANEL
ALT: 24 U/L (ref 0–44)
AST: 16 U/L (ref 15–41)
Albumin: 3.8 g/dL (ref 3.5–5.0)
Alkaline Phosphatase: 100 U/L (ref 38–126)
Anion gap: 11 (ref 5–15)
BUN: 12 mg/dL (ref 6–20)
CHLORIDE: 107 mmol/L (ref 98–111)
CO2: 22 mmol/L (ref 22–32)
Calcium: 10 mg/dL (ref 8.9–10.3)
Creatinine, Ser: 0.84 mg/dL (ref 0.44–1.00)
GFR calc Af Amer: >60 mL/min (ref >60)
Glucose, Bld: 97 mg/dL (ref 70–99)
POTASSIUM: 3.8 mmol/L (ref 3.5–5.1)
SODIUM: 140 mmol/L (ref 135–145)
Total Bilirubin: 0.6 mg/dL (ref 0.3–1.2)
Total Protein: 7.1 g/dL (ref 6.5–8.1)

## 2017-12-12 LAB — CBC WITH DIFFERENTIAL/PLATELET
Abs Immature Granulocytes: 0.1 10*3/uL (ref 0.0–0.1)
BASOS ABS: 0.1 10*3/uL (ref 0.0–0.1)
BASOS PCT: 1 %
EOS ABS: 0.1 10*3/uL (ref 0.0–0.7)
Eosinophils Relative: 1 %
HCT: 42.7 % (ref 36.0–46.0)
Hemoglobin: 13.9 g/dL (ref 12.0–15.0)
IMMATURE GRANULOCYTES: 1 %
Lymphocytes Relative: 22 %
Lymphs Abs: 1.7 10*3/uL (ref 0.7–4.0)
MCH: 29.2 pg (ref 26.0–34.0)
MCHC: 32.6 g/dL (ref 30.0–36.0)
MCV: 89.7 fL (ref 78.0–100.0)
MONOS PCT: 8 %
Monocytes Absolute: 0.6 10*3/uL (ref 0.1–1.0)
NEUTROS ABS: 5.2 10*3/uL (ref 1.7–7.7)
NEUTROS PCT: 67 %
PLATELETS: 281 10*3/uL (ref 150–400)
RBC: 4.76 MIL/uL (ref 3.87–5.11)
RDW: 13.9 % (ref 11.5–15.5)
WBC: 7.7 10*3/uL (ref 4.0–10.5)

## 2017-12-12 LAB — I-STAT CHEM 8, ED
BUN: 12 mg/dL (ref 6–20)
CREATININE: 0.7 mg/dL (ref 0.44–1.00)
Calcium, Ion: 1.26 mmol/L (ref 1.15–1.40)
Chloride: 106 mmol/L (ref 98–111)
Glucose, Bld: 92 mg/dL (ref 70–99)
HEMATOCRIT: 41 % (ref 36.0–46.0)
HEMOGLOBIN: 13.9 g/dL (ref 12.0–15.0)
POTASSIUM: 3.7 mmol/L (ref 3.5–5.1)
Sodium: 140 mmol/L (ref 135–145)
TCO2: 20 mmol/L — ABNORMAL LOW (ref 22–32)

## 2017-12-12 LAB — I-STAT TROPONIN, ED: TROPONIN I, POC: 0.01 ng/mL (ref 0.00–0.08)

## 2017-12-12 MED ORDER — IOPAMIDOL (ISOVUE-370) INJECTION 76%
INTRAVENOUS | Status: AC
  Filled 2017-12-12: qty 100

## 2017-12-12 MED ORDER — IOPAMIDOL (ISOVUE-370) INJECTION 76%
100.0000 mL | Freq: Once | INTRAVENOUS | Status: AC | PRN
  Administered 2017-12-12: 100 mL via INTRAVENOUS

## 2017-12-12 MED ORDER — ALBUTEROL SULFATE (2.5 MG/3ML) 0.083% IN NEBU: 5.0000 mg | INHALATION_SOLUTION | Freq: Once | RESPIRATORY_TRACT | Status: DC

## 2017-12-12 NOTE — Discharge Instructions (Addendum)
Work-up here for the shortness of breath without any acute findings no evidence of a blood clot in the lungs.  No evidence of infection.  Lab work without any significant abnormalities.  Recommend following back up with your OB/GYN.  Return for any new or worse symptoms.

## 2017-12-12 NOTE — ED Notes (Signed)
Pt asking RN for update, RN passed message along to EDP. Pt informed that MD is aware and will update her as soon as possible.

## 2017-12-12 NOTE — Telephone Encounter (Signed)
Patient had surgery Monday and is having shortness of breath and fever.

## 2017-12-12 NOTE — Addendum Note (Signed)
Addended by: Megan Salon on: 12/12/2017 12:09 PM   Modules accepted: Orders

## 2017-12-12 NOTE — ED Provider Notes (Signed)
[facility] Provider Note   CSN: [id number] Arrival date & time: 12/12/17  1239     History   Chief Complaint Chief Complaint  Patient presents with  . Shortness of Breath    HPI Stacy Ochoa is a 42 y.o. female history of anemia from heavy menstrual period s/p hysterectomy on 8/19 (POD #4), here presenting with shortness of breath.  Patient states that since yesterday she has some subjective shortness of breath.  Se states that it is hard for her to catch her breath.  She used her albuterol nebulizer several times with no relief.  She also has low-grade temperature at home but denies any actual fevers.  Denies any wheezing.  She denies any vaginal bleeding and saw her GYN doctor this morning.  Had a pelvic exam done that was unremarkable and was sent here for CTA to rule out PE.  Patient denies any previous blood clots and denies any leg pain or swelling.   The history is provided by the patient.    Past Medical History:  Diagnosis Date  . Anemia 2019   due to heavy vag bleeding   . Asthma   . Migraine with aura    gets ~ 1-2 a month - controlled with medication   . PONV (postoperative nausea and vomiting)     Patient Active Problem List   Diagnosis Date Noted  . IDA (iron deficiency anemia) 09/18/2017  . Migraines 09/05/2017  . Mild intermittent asthma with acute exacerbation 06/23/2015    Past Surgical History:  Procedure Laterality Date  . BREAST REDUCTION SURGERY  1994  . CYSTOSCOPY N/A 12/08/2017   Procedure: CYSTOSCOPY;  Surgeon: Megan Salon, MD;  Location: [facility];  Service: Gynecology;  Laterality: N/A;  . TOTAL LAPAROSCOPIC HYSTERECTOMY WITH SALPINGECTOMY Bilateral 12/08/2017   Procedure: TOTAL LAPAROSCOPIC HYSTERECTOMY WITH SALPINGECTOMY;  Surgeon: Megan Salon, MD;  Location: [facility];  Service: Gynecology;  Laterality: Bilateral;  specimen weight 233 g     OB History    Gravida    2   Para  2   Term  2   Preterm      AB      Living  2     SAB      TAB      Ectopic      Multiple      Live Births  2            Home Medications    Prior to Admission medications   Medication Sig Start Date End Date Taking? Authorizing Provider  acetaminophen (TYLENOL) 500 MG tablet Take 500 mg by mouth every 8 (eight) hours as needed.    [provider]  albuterol (PROVENTIL HFA) 108 (90 Base) MCG/ACT inhaler Inhale into the lungs daily as needed. 09/19/16 09/19/20  [provider]  albuterol (PROVENTIL) (2.5 MG/3ML) 0.083% nebulizer solution Take 2.5 mg by nebulization every 4 (four) hours as needed for wheezing or shortness of breath.  04/25/16   [provider]  baclofen (LIORESAL) 10 MG tablet Take 1-2 tablets by mouth 3 (three) times daily as needed (migraines).  03/04/17   [provider]  eletriptan (RELPAX) 40 MG tablet Take 1 tablet by mouth every 2 (two) hours as needed for migraine.  05/20/17   [provider]  EMGALITY 120 MG/ML SOAJ INJECT 120 MG SQ ONCE MONTHLY 08/14/17   [provider]  EPINEPHrine (EPIPEN 2-PAK) 0.3 mg/0.3  mL IJ SOAJ injection Inject 0.3 mg into the muscle once as needed (anaphylaxis).  02/05/15   [provider]  ergocalciferol (VITAMIN D2) 50000 units capsule Take 1 capsule (50,000 Units total) by mouth once a week. Patient not taking: Reported on 12/12/2017 09/19/17   [place], Holli Humbles, NP  fluticasone [facility]) 50 MCG/ACT nasal spray Place 2 sprays into both nostrils 2 (two) times daily.  09/02/17   [provider]  montelukast (SINGULAIR) 10 MG tablet Take 10 mg by mouth at bedtime.  05/06/17   [provider]  Multiple Vitamin (MULTIVITAMIN) tablet Take 1 tablet by mouth daily.    [provider]  oxyCODONE-acetaminophen (PERCOCET) 5-325 MG tablet Take 1-2 tablets by mouth every 4 (four) hours as needed for up to 5 days for moderate pain. use only  as much as needed to relieve pain Patient not taking: Reported on 12/12/2017 12/08/17 12/13/17  Megan Salon, MD  topiramate (TOPAMAX) 100 MG tablet Take 100 mg by mouth daily. 09/02/17   [provider]  vitamin B-12 (CYANOCOBALAMIN) 1000 MCG tablet Take by mouth daily.    [provider]    Family History Family History  Problem Relation Age of Onset  . Hypertension Mother   . Diabetes Mother   . Heart disease Maternal Grandfather   . Osteoporosis Maternal Aunt     Social History Social History   Tobacco Use  . Smoking status: Never Smoker  . Smokeless tobacco: Never Used  Substance Use Topics  . Alcohol use: No  . Drug use: No     Allergies   Aspirin and Shellfish allergy   Review of Systems Review of Systems  Respiratory: Positive for shortness of breath.   All other systems reviewed and are negative.    Physical Exam Updated Vital Signs BP 106/75   Pulse 63   Temp 99.2 F (37.3 C) (Oral)   Resp 11   Ht 5\' 6"  (1.676 m)   Wt 79.8 kg   LMP 11/06/2017 (Exact Date)   SpO2 100%   BMI 28.41 kg/m   Physical Exam  Constitutional: She appears well-developed.  HENT:  Head: Normocephalic.  Mouth/Throat: Oropharynx is clear and moist.  Eyes: Pupils are equal, round, and reactive to light. EOM are normal.  Neck: Normal range of motion.  Cardiovascular: Normal rate.  Pulmonary/Chest: Effort normal and breath sounds normal.  Abdominal: Soft. Bowel sounds are normal.  Musculoskeletal: Normal range of motion.       Right lower leg: Normal. She exhibits no tenderness and no edema.       Left lower leg: Normal. She exhibits no edema.  Neurological: She is alert.  Skin: Skin is warm. Capillary refill takes less than 2 seconds.  Psychiatric: She has a normal mood and affect. Her behavior is normal.  Nursing note and vitals reviewed.    ED Treatments / Results  Labs (all labs ordered are listed, but only abnormal results are displayed) Labs  Reviewed  I-STAT CHEM 8, ED - Abnormal; Notable for the following components:      Result Value   TCO2 20 (*)    All other components within normal limits  CBC WITH DIFFERENTIAL/PLATELET  COMPREHENSIVE METABOLIC PANEL  I-STAT TROPONIN, ED    EKG EKG Interpretation  Date/Time:  Friday December 12 2017 13:14:14 EDT Ventricular Rate:  68 PR Interval:    QRS Duration: 92 QT Interval:  433 QTC Calculation: 461 R Axis:   77 Text Interpretation:  Sinus  rhythm Minimal ST depression, inferior leads No previous ECGs available Confirmed by Wandra Arthurs ([id number]) on 12/12/2017 1:32:05 PM   Radiology Dg Chest 2 View  Result Date: 12/12/2017 CLINICAL DATA:  Hysterectomy on Monday, chest pain with shortness of breath and dizziness starting last night. EXAM: CHEST - 2 VIEW COMPARISON:  Report from chest radiograph from 04/16/2013 FINDINGS: Cardiac and mediastinal margins appear normal. The lungs appear clear. No pleural effusion. No appreciable osseous abnormality. IMPRESSION: 1.  No active cardiopulmonary disease is radiographically apparent. Electronically Signed   By: Van Clines M.D.   On: 12/12/2017 13:48    Procedures Procedures (including critical care time)  Medications Ordered in ED Medications - No data to display   Initial Impression / Assessment and Plan / ED Course  I have reviewed the triage vital signs and the nursing notes.  Pertinent labs & imaging results that were available during my care of the patient were reviewed by me and considered in my medical decision making (see chart for details).     Landis Cassaro is a 42 y.o. female here with SOB after hysterectomy. Vitals stable. No signs of DVT. Will order CTA chest, labs. No wheezing to suggest asthma exacerbation.  4:00 PM CTA pending. If negative, anticipate dc home. Signed out to Dr. Rogene Houston in the ED.    Final Clinical Impressions(s) / ED Diagnoses   Final diagnoses:  None    ED Discharge Orders    None        Drenda Freeze, MD 12/12/17 1601

## 2017-12-12 NOTE — ED Triage Notes (Signed)
Pt had a hysterectomy 4 days ago and has been having shortness of breath since last night.  Pt was seen by her surgeon Dr. Sabra Heck who did a pelvic exam which was wnl and some labs.  Pt reports that this SOB is worse on exertion, no swelling to legs.  Pt denies any pain and per EMS report the surgery went well.

## 2017-12-12 NOTE — ED Provider Notes (Signed)
Patient CT angios without any acute findings.  Also lab work is normal.  Patient stable for discharge home.  No evidence of pulmonary embolism no evidence of pneumonia or any other significant pulmonary abnormalities.  Results for orders placed or performed during the hospital encounter of 12/12/17  CBC with Differential/Platelet  Result Value Ref Range   WBC 7.7 4.0 - 10.5 K/uL   RBC 4.76 3.87 - 5.11 MIL/uL   Hemoglobin 13.9 12.0 - 15.0 g/dL   HCT 42.7 36.0 - 46.0 %   MCV 89.7 78.0 - 100.0 fL   MCH 29.2 26.0 - 34.0 pg   MCHC 32.6 30.0 - 36.0 g/dL   RDW 13.9 11.5 - 15.5 %   Platelets 281 150 - 400 K/uL   Neutrophils Relative % 67 %   Neutro Abs 5.2 1.7 - 7.7 K/uL   Lymphocytes Relative 22 %   Lymphs Abs 1.7 0.7 - 4.0 K/uL   Monocytes Relative 8 %   Monocytes Absolute 0.6 0.1 - 1.0 K/uL   Eosinophils Relative 1 %   Eosinophils Absolute 0.1 0.0 - 0.7 K/uL   Basophils Relative 1 %   Basophils Absolute 0.1 0.0 - 0.1 K/uL   Immature Granulocytes 1 %   Abs Immature Granulocytes 0.1 0.0 - 0.1 K/uL  Comprehensive metabolic panel  Result Value Ref Range   Sodium 140 135 - 145 mmol/L   Potassium 3.8 3.5 - 5.1 mmol/L   Chloride 107 98 - 111 mmol/L   CO2 22 22 - 32 mmol/L   Glucose, Bld 97 70 - 99 mg/dL   BUN 12 6 - 20 mg/dL   Creatinine, Ser 0.84 0.44 - 1.00 mg/dL   Calcium 10.0 8.9 - 10.3 mg/dL   Total Protein 7.1 6.5 - 8.1 g/dL   Albumin 3.8 3.5 - 5.0 g/dL   AST 16 15 - 41 U/L   ALT 24 0 - 44 U/L   Alkaline Phosphatase 100 38 - 126 U/L   Total Bilirubin 0.6 0.3 - 1.2 mg/dL   GFR calc non Af Amer >60 >60 mL/min   GFR calc Af Amer >60 >60 mL/min   Anion gap 11 5 - 15  I-stat troponin, ED  Result Value Ref Range   Troponin i, poc 0.01 0.00 - 0.08 ng/mL   Comment 3          I-stat chem 8, ed  Result Value Ref Range   Sodium 140 135 - 145 mmol/L   Potassium 3.7 3.5 - 5.1 mmol/L   Chloride 106 98 - 111 mmol/L   BUN 12 6 - 20 mg/dL   Creatinine, Ser 0.70 0.44 - 1.00 mg/dL   Glucose, Bld 92 70 - 99 mg/dL   Calcium, Ion 1.26 1.15 - 1.40 mmol/L   TCO2 20 (L) 22 - 32 mmol/L   Hemoglobin 13.9 12.0 - 15.0 g/dL   HCT 41.0 36.0 - 46.0 %   Dg Chest 2 View  Result Date: 12/12/2017 CLINICAL DATA:  Hysterectomy on Monday, chest pain with shortness of breath and dizziness starting last night. EXAM: CHEST - 2 VIEW COMPARISON:  Report from chest radiograph from 04/16/2013 FINDINGS: Cardiac and mediastinal margins appear normal. The lungs appear clear. No pleural effusion. No appreciable osseous abnormality. IMPRESSION: 1.  No active cardiopulmonary disease is radiographically apparent. Electronically Signed   By: Van Clines M.D.   On: 12/12/2017 13:48   Ct Angio Chest Pe W And/or Wo Contrast  Result Date: 12/12/2017 CLINICAL DATA:  Shortness of  breath. EXAM: CT ANGIOGRAPHY CHEST WITH CONTRAST TECHNIQUE: Multidetector CT imaging of the chest was performed using the standard protocol during bolus administration of intravenous contrast. Multiplanar CT image reconstructions and MIPs were obtained to evaluate the vascular anatomy. CONTRAST:  60 mL ISOVUE-370 IOPAMIDOL (ISOVUE-370) INJECTION 76% COMPARISON:  Radiographs of same day. FINDINGS: Cardiovascular: Satisfactory opacification of the pulmonary arteries to the segmental level. No evidence of pulmonary embolism. Normal heart size. No pericardial effusion. 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 Mediastinum/Nodes: No enlarged mediastinal, hilar, or axillary lymph nodes. Thyroid gland, trachea, and esophagus demonstrate no significant findings. Lungs/Pleura: Lungs are clear. No pleural effusion or pneumothorax. Upper Abdomen: No acute abnormality. Musculoskeletal: No chest wall abnormality. No acute or significant osseous findings. Review of the MIP images confirms the above findings. IMPRESSION: No definite evidence of pulmonary embolus. No definite abnormality seen in the chest. Electronically Signed    By: Marijo Conception, M.D.   On: 12/12/2017 16:23      Fredia Sorrow, MD 12/12/17 1818

## 2017-12-12 NOTE — Telephone Encounter (Signed)
Late entry for 0923: Pt reports 3 days of feeling warm and temperature max 100.0. Feels worse at night. Feels some possible swelling in her neck. Last night felt the worse and felt new sob. Used inhaler, this helped her, reports it feels like it hurts to take a deep breath. No chest pain or dizziness.  States "I feel like I am fighting off a cold."  One of her adult children can bring her to office visit. Office visit with Dr. Sabra Heck scheduled for this morning. Office visit per Dr. Sabra Heck.  Encounter closed.

## 2017-12-12 NOTE — Progress Notes (Signed)
GYNECOLOGY  VISIT  CC:   SOB  HPI: 42 y.o. [id number] Married Hispanic female here for SOB and chest pressure tightness on her sternum for the last 12-18 hours.  She feels like she's had a low grade fever the last two nights.  She's not having any BV.  Having bowel movements.  Emptying her bladder well.  Has not eaten anything today.  Only had water.  Denies nausea.  Had some right shoulder pain post op that is almost completely resolved.  Hasn't taken any percocet since Wednesday as pain is not that significant.  Did take some tylenol this morning.  Cannot take motrin as has a real motrin/aspirin allergy.  Had TLH/bilateral salpingectomy/cystoscopy on 12/08/17. Did receive lovenox perioperatively.    GYNECOLOGIC HISTORY: Patient's last menstrual period was 11/06/2017 (exact date). Contraception: hysterectomy  Menopausal hormone therapy: none  Patient Active Problem List   Diagnosis Date Noted  . IDA (iron deficiency anemia) 09/18/2017  . Migraines 09/05/2017  . Mild intermittent asthma with acute exacerbation 06/23/2015    Past Medical History:  Diagnosis Date  . Anemia 2019   due to heavy vag bleeding   . Asthma   . Migraine with aura    gets ~ 1-2 a month - controlled with medication   . PONV (postoperative nausea and vomiting)     Past Surgical History:  Procedure Laterality Date  . BREAST REDUCTION SURGERY  1994  . CYSTOSCOPY N/A 12/08/2017   Procedure: CYSTOSCOPY;  Surgeon: Megan Salon, MD;  Location: [facility];  Service: Gynecology;  Laterality: N/A;  . TOTAL LAPAROSCOPIC HYSTERECTOMY WITH SALPINGECTOMY Bilateral 12/08/2017   Procedure: TOTAL LAPAROSCOPIC HYSTERECTOMY WITH SALPINGECTOMY;  Surgeon: Megan Salon, MD;  Location: [facility];  Service: Gynecology;  Laterality: Bilateral;  specimen weight 233 g    MEDS:   Current Outpatient Medications on File Prior to Visit  Medication Sig Dispense Refill  . acetaminophen (TYLENOL) 500 MG  tablet Take 500 mg by mouth every 8 (eight) hours as needed.    Marland Kitchen albuterol (PROVENTIL HFA) 108 (90 Base) MCG/ACT inhaler Inhale into the lungs daily as needed.    . montelukast (SINGULAIR) 10 MG tablet Take 10 mg by mouth at bedtime.     . topiramate (TOPAMAX) 100 MG tablet Take 100 mg by mouth daily.  0  . albuterol (PROVENTIL) (2.5 MG/3ML) 0.083% nebulizer solution Take 2.5 mg by nebulization every 4 (four) hours as needed for wheezing or shortness of breath.     . baclofen (LIORESAL) 10 MG tablet Take 1-2 tablets by mouth 3 (three) times daily as needed (migraines).     . eletriptan (RELPAX) 40 MG tablet Take 1 tablet by mouth every 2 (two) hours as needed for migraine.     Marland Kitchen EMGALITY 120 MG/ML SOAJ INJECT 120 MG SQ ONCE MONTHLY  10  . EPINEPHrine (EPIPEN 2-PAK) 0.3 mg/0.3 mL IJ SOAJ injection Inject 0.3 mg into the muscle once as needed (anaphylaxis).     Marland Kitchen ergocalciferol (VITAMIN D2) 50000 units capsule Take 1 capsule (50,000 Units total) by mouth once a week. (Patient not taking: Reported on 12/12/2017) 8 capsule 6  . fluticasone (FLONASE) 50 MCG/ACT nasal spray Place 2 sprays into both nostrils 2 (two) times daily.     . Multiple Vitamin (MULTIVITAMIN) tablet Take 1 tablet by mouth daily.    Marland Kitchen oxyCODONE-acetaminophen (PERCOCET) 5-325 MG tablet Take 1-2 tablets by mouth every 4 (four) hours as needed for up to 5 days  for moderate pain. use only as much as needed to relieve pain (Patient not taking: Reported on 12/12/2017) 30 tablet 0  . vitamin B-12 (CYANOCOBALAMIN) 1000 MCG tablet Take by mouth daily.     No current facility-administered medications on file prior to visit.     ALLERGIES: Aspirin and Shellfish allergy  Family History  Problem Relation Age of Onset  . Hypertension Mother   . Diabetes Mother   . Heart disease Maternal Grandfather   . Osteoporosis Maternal Aunt     SH:  Married, non smoker  Review of Systems  Constitutional: Positive for chills and fever.   Respiratory: Positive for cough and shortness of breath.   Cardiovascular: Positive for chest pain.  Gastrointestinal: Positive for nausea.  Neurological: Positive for headaches.  Endo/Heme/Allergies:       Swollen glands   All other systems reviewed and are negative.   PHYSICAL EXAMINATION:    BP 122/88 (BP Location: Right Arm, Patient Position: Sitting, Cuff Size: Normal)   Pulse 68   Temp 98.1 F (36.7 C) (Oral)   Resp 18   Ht 5\' 6"  (1.676 m)   Wt 176 lb 12.8 oz (80.2 kg)   LMP 11/06/2017 (Exact Date)   BMI 28.54 kg/m    Pulse ox: 99% General appearance: alert, cooperative and appears stated age, appears tired CV:  Regular rate and rhythm Lungs:  Clear, pt taking shallow breaths, no rales, rhonchi, crackles Abdomen: soft, non-tender; bowel sounds normal; no masses,  no organomegaly Inc:  C/D/I  Pelvic: External genitalia:  no lesions              Urethra:  normal appearing urethra with no masses, tenderness or lesions              Bartholins and Skenes: normal                 Vagina: normal appearing vagina with normal color and discharge, no lesions              Cervix: absent              Bimanual Exam:  Uterus:  uterus absent , cuff intact without fullness  Chaperone was present for exam.  Assessment: Four days post op from TLH/bilateral salpingectomy/cystoscopy SOB Low grade fever per her report  Plan: Due to SOB and Chest pressure, feel evaluation in ER is best.  Feel she needs chest CT to r/o PE.  Chest CT was already scheduled with [facility] radiology after her call a little earlier this morning.  Will cancel this now.

## 2017-12-14 ENCOUNTER — Other Ambulatory Visit: Payer: Self-pay | Admitting: Obstetrics & Gynecology

## 2017-12-14 LAB — URINE CULTURE

## 2017-12-14 MED ORDER — NITROFURANTOIN MONOHYD MACRO 100 MG PO CAPS: 100.0000 mg | ORAL_CAPSULE | Freq: Two times a day (BID) | ORAL | 0 refills | Status: DC

## 2017-12-14 NOTE — Progress Notes (Signed)
macrobid 100mg  bid x 5 days sent to pharmacy

## 2017-12-15 ENCOUNTER — Ambulatory Visit: Payer: 59 | Admitting: Obstetrics & Gynecology

## 2017-12-18 ENCOUNTER — Encounter: Payer: Self-pay | Admitting: Obstetrics & Gynecology

## 2017-12-18 ENCOUNTER — Ambulatory Visit (INDEPENDENT_AMBULATORY_CARE_PROVIDER_SITE_OTHER): Payer: 59 | Admitting: Obstetrics & Gynecology

## 2017-12-18 VITALS — BP 110/68 | HR 66 | Resp 14 | Ht 66.0 in | Wt 180.0 lb

## 2017-12-18 DIAGNOSIS — Z9889 Other specified postprocedural states: Secondary | ICD-10-CM

## 2017-12-18 DIAGNOSIS — N3090 Cystitis, unspecified without hematuria: Secondary | ICD-10-CM

## 2017-12-18 NOTE — Progress Notes (Signed)
Post Operative Visit   Procedure: Total Laparoscopic Hysterectomy with Salpingectomy, Cystoscopy.  Days Post-op: 10  Subjective: Doing well.  Denies vaginal bleeding.  Is working from home this week.  Energy is good.  Wants to go back to work next week.  Having normal bowel movements.  Still taking stool softener.  Not taking anything for pain.  When she goes to void, she feels like the void volume is low.  Feels like she is emptying her bladder well.    Objective: LMP 11/06/2017 (Exact Date)   EXAM General: alert, cooperative and no distress Resp: clear to auscultation bilaterally Cardio: regular rate and rhythm, S1, S2 normal, no murmur, click, rub or gallop GI: soft, non-tender; bowel sounds normal; no masses,  no organomegaly and incision: clean, dry, intact and with decreased bruising Extremities: extremities normal, atraumatic, no cyanosis or edema Vaginal Bleeding: none  Assessment: s/p TLH/bilateral salpingectomy/cystoscopy  Plan: Recheck 4 weeks Letter given for return to work with limitations Pt is going to return next week to leave repeat urine culture She is also going to measure intake and voids for me over the weeken.

## 2017-12-19 ENCOUNTER — Ambulatory Visit: Payer: Self-pay

## 2017-12-26 ENCOUNTER — Ambulatory Visit (INDEPENDENT_AMBULATORY_CARE_PROVIDER_SITE_OTHER): Payer: 59

## 2017-12-26 VITALS — BP 110/68 | HR 76 | Resp 16 | Ht 66.0 in | Wt 180.0 lb

## 2017-12-26 DIAGNOSIS — Z9889 Other specified postprocedural states: Secondary | ICD-10-CM | POA: Diagnosis not present

## 2017-12-26 NOTE — Progress Notes (Signed)
Patient is here for a test of cure urine Culture. She states that she was instructed to measure her intake and exspelltion on 12/20/17 and 12/21/17 those amounts are as follows  12/20/17  Intake 20oz of fluid   Out 400 mil Saturday Am urin not measured   12/21/17  Intake 18 0z   Out - 1050 ml   Patient is not having any symptoms today.

## 2017-12-27 LAB — URINE CULTURE

## 2018-01-19 ENCOUNTER — Encounter: Payer: Self-pay | Admitting: Obstetrics & Gynecology

## 2018-01-19 ENCOUNTER — Ambulatory Visit (INDEPENDENT_AMBULATORY_CARE_PROVIDER_SITE_OTHER): Payer: 59 | Admitting: Obstetrics & Gynecology

## 2018-01-19 VITALS — BP 108/70 | HR 88 | Resp 16 | Ht 66.0 in | Wt 181.4 lb

## 2018-01-19 DIAGNOSIS — D500 Iron deficiency anemia secondary to blood loss (chronic): Secondary | ICD-10-CM

## 2018-01-19 NOTE — Progress Notes (Signed)
Post Operative Visit  Procedure: Total Laparoscopic Hysterectomy with Salpingectomy, Cystoscopy.  Days Post-op: 42  Subjective: Reports she is doing really well.  Back at work full time.  Energy is really good.  Bowel movements and bladder function is normal.  Is not taking anything for pain.  Hasn't taken anything for pain in several weeks.  Has not started exercising again.  Discussed with pt limitations today.   Objective: BP 108/70 (BP Location: Right Arm, Patient Position: Supine, Cuff Size: Normal)   Pulse 88   Resp 16   Ht 5\' 6"  (1.676 m)   Wt 181 lb 6.4 oz (82.3 kg)   LMP 11/06/2017 (Exact Date)   BMI 29.28 kg/m   EXAM General: alert, cooperative and no distress Resp: clear to auscultation bilaterally Cardio: regular rate and rhythm, S1, S2 normal, no murmur, click, rub or gallop GI: soft, non-tender; bowel sounds normal; no masses,  no organomegaly and incision: clean, dry and intact Extremities: extremities normal, atraumatic, no cyanosis or edema Vaginal Bleeding: none Gyn: NEAFG, vagina without lesions, cuff intact and healing well, no visible sutures, no masses, non tender Assessment: s/p TLH/bilateral salpingectomy/cystoscopy Iron deficiency  Plan: Recheck at [facility] Iron, TIBC, retic ordered today.  CBC was normal post op.

## 2018-01-20 LAB — RETICULOCYTES: Retic Ct Pct: 1.4 % (ref 0.6–2.6)

## 2018-01-20 LAB — IRON,TIBC AND FERRITIN PANEL
Ferritin: 54 ng/mL (ref 15–150)
Iron Saturation: 15 % (ref 15–55)
Iron: 50 ug/dL (ref 27–159)
TIBC: 332 ug/dL (ref 250–450)
UIBC: 282 ug/dL (ref 131–425)

## 2018-01-27 ENCOUNTER — Encounter: Payer: Self-pay | Admitting: Obstetrics & Gynecology

## 2018-06-24 DIAGNOSIS — E559 Vitamin D deficiency, unspecified: Secondary | ICD-10-CM | POA: Insufficient documentation

## 2018-09-07 ENCOUNTER — Ambulatory Visit: Payer: 59 | Admitting: Obstetrics & Gynecology

## 2018-09-09 ENCOUNTER — Telehealth: Payer: Self-pay | Admitting: Obstetrics & Gynecology

## 2018-09-09 NOTE — Telephone Encounter (Signed)
Spoke with patient. Reports vaginal itching, discomfort and odor, not fishy. Symptoms started 5/19, asking for Rx for yeast. Denies pain, vaginal bleeding or urinary symptoms. Has not tried anything OTC. Recommended patient try OTC monistat 1 or 3 day, if symptoms do not resolve or symptoms worsen, return call to office. Patient is scheduled for [facility] 09/28/18. Patient verbalizes understanding and is agreeable to plan.   Routing to provider for final review. Patient is agreeable to disposition. Will close encounter.

## 2018-09-09 NOTE — Telephone Encounter (Signed)
Patient has yeast symptoms. 

## 2018-09-24 ENCOUNTER — Other Ambulatory Visit: Payer: Self-pay

## 2018-09-28 ENCOUNTER — Encounter: Payer: Self-pay | Admitting: Obstetrics & Gynecology

## 2018-09-28 ENCOUNTER — Ambulatory Visit: Payer: 59 | Admitting: Obstetrics & Gynecology

## 2018-09-28 ENCOUNTER — Other Ambulatory Visit: Payer: Self-pay

## 2018-09-28 VITALS — BP 102/76 | HR 102 | Temp 97.7°F | Ht 65.5 in | Wt 192.6 lb

## 2018-09-28 DIAGNOSIS — Z01419 Encounter for gynecological examination (general) (routine) without abnormal findings: Secondary | ICD-10-CM

## 2018-09-28 DIAGNOSIS — E559 Vitamin D deficiency, unspecified: Secondary | ICD-10-CM | POA: Diagnosis not present

## 2018-09-28 NOTE — Progress Notes (Signed)
43 y.o. G65P2002 Married White or Caucasian female here for annual exam.  Son graduated from high school.  He wants to be in the [employer].  Going to go to [employer] in kinesiology program.    Denies vaginal bleeding.    Reports having some weakness and she had blood work done showing ferritin was 72 and hb was 14.2.  This was done in February at [facility] and reviewed in [place].    Patient's last menstrual period was 11/06/2017 (exact date).          Sexually active: Yes.    The current method of family planning is status post hysterectomy.    Exercising: No.   Smoker:  no  Health Maintenance: Pap:  11/10/17 Neg. HR HPV:neg  History of abnormal Pap:  no MMG: 09/23/17 BIRADS1:neg  Colonoscopy: 04/11/2017 normal TDaP:  2014  Pneumonia vaccine(s): 2018  Screening Labs: PCP   reports that she has never smoked. She has never used smokeless tobacco. She reports that she does not drink alcohol or use drugs.  Past Medical History:  Diagnosis Date  . Anemia 2019   due to heavy vag bleeding   . Asthma   . Migraine with aura    gets ~ 1-2 a month - controlled with medication   . PONV (postoperative nausea and vomiting)     Past Surgical History:  Procedure Laterality Date  . BREAST REDUCTION SURGERY  1994  . CYSTOSCOPY N/A 12/08/2017   Procedure: CYSTOSCOPY;  Surgeon: Megan Salon, MD;  Location: [facility];  Service: Gynecology;  Laterality: N/A;  . TOTAL LAPAROSCOPIC HYSTERECTOMY WITH SALPINGECTOMY Bilateral 12/08/2017   Procedure: TOTAL LAPAROSCOPIC HYSTERECTOMY WITH SALPINGECTOMY;  Surgeon: Megan Salon, MD;  Location: [facility];  Service: Gynecology;  Laterality: Bilateral;  specimen weight 233 g    Current Outpatient Medications  Medication Sig Dispense Refill  . albuterol (PROVENTIL HFA) 108 (90 Base) MCG/ACT inhaler Inhale into the lungs daily as needed.    Marland Kitchen albuterol (PROVENTIL) (2.5 MG/3ML) 0.083% nebulizer solution Take 2.5 mg by  nebulization every 4 (four) hours as needed for wheezing or shortness of breath.     . baclofen (LIORESAL) 10 MG tablet Take 1-2 tablets by mouth 3 (three) times daily as needed (migraines).     . eletriptan (RELPAX) 40 MG tablet Take 1 tablet by mouth every 2 (two) hours as needed for migraine.     Marland Kitchen EPINEPHrine (EPIPEN 2-PAK) 0.3 mg/0.3 mL IJ SOAJ injection Inject 0.3 mg into the muscle once as needed (anaphylaxis).     . fluticasone (FLONASE) 50 MCG/ACT nasal spray Place 2 sprays into both nostrils 2 (two) times daily.     . montelukast (SINGULAIR) 10 MG tablet Take 10 mg by mouth at bedtime.      No current facility-administered medications for this visit.     Family History  Problem Relation Age of Onset  . Hypertension Mother   . Diabetes Mother   . Heart disease Maternal Grandfather   . Osteoporosis Maternal Aunt     Review of Systems  Neurological:       Migraines   All other systems reviewed and are negative.   Exam:   BP 102/76   Pulse (!) 102   Temp 97.7 F (36.5 C) (Temporal)   Ht 5' 5.5" (1.664 m)   Wt 192 lb 9.6 oz (87.4 kg)   LMP 11/06/2017 (Exact Date)   BMI 31.56 kg/m  Height: 5' 5.5" (166.4 cm)  Ht Readings from Last 3 Encounters:  09/28/18 5' 5.5" (1.664 m)  01/19/18 5\' 6"  (1.676 m)  12/26/17 5\' 6"  (1.676 m)    General appearance: alert, cooperative and appears stated age Head: Normocephalic, without obvious abnormality, atraumatic Neck: no adenopathy, supple, symmetrical, trachea midline and thyroid normal to inspection and palpation Lungs: clear to auscultation bilaterally Breasts: normal appearance, no masses or tenderness Heart: regular rate and rhythm Abdomen: soft, non-tender; bowel sounds normal; no masses,  no organomegaly Extremities: extremities normal, atraumatic, no cyanosis or edema Skin: Skin color, texture, turgor normal. No rashes or lesions Lymph nodes: Cervical, supraclavicular, and axillary nodes normal. No abnormal inguinal  nodes palpated Neurologic: Grossly normal   Pelvic: External genitalia:  no lesions              Urethra:  normal appearing urethra with no masses, tenderness or lesions              Bartholins and Skenes: normal                 Vagina: normal appearing vagina with normal color and discharge, no lesions              Cervix: absent              Pap taken: No. Bimanual Exam:  Uterus:  uterus absent              Adnexa: no mass, fullness, tenderness               Rectovaginal: Confirms               Anus:  normal sphincter tone, no lesions  Chaperone was present for exam.  A:  Well Woman with normal exam Vit D deficiency H/o menorrhagia with iron deficiency anemia that resolved with TLH Asthma H/O migraine with aura  P:   Mammogram guidelines reviewed.  Yearly recommended. pap smear not indicated Vit d obtained today Vaccines UTD Blood work recently done with PCP and reviewed in [place]. Return annually or prn

## 2018-09-29 ENCOUNTER — Other Ambulatory Visit: Payer: Self-pay | Admitting: *Deleted

## 2018-09-29 DIAGNOSIS — E559 Vitamin D deficiency, unspecified: Secondary | ICD-10-CM

## 2018-09-29 LAB — VITAMIN D 25 HYDROXY (VIT D DEFICIENCY, FRACTURES): Vit D, 25-Hydroxy: 19.3 ng/mL — ABNORMAL LOW (ref 30.0–100.0)

## 2018-09-29 MED ORDER — VITAMIN D (ERGOCALCIFEROL) 1.25 MG (50000 UNIT) PO CAPS: 50000.0000 [IU] | ORAL_CAPSULE | ORAL | 0 refills | Status: DC

## 2018-11-13 ENCOUNTER — Telehealth: Payer: Self-pay | Admitting: *Deleted

## 2018-11-13 ENCOUNTER — Encounter: Payer: Self-pay | Admitting: Obstetrics & Gynecology

## 2018-11-13 NOTE — Telephone Encounter (Signed)
Patient is over at [facility] for routine mammogram now but left breast is lumpy and has a rash. Would like an order faxed over for a diagnostic mammogram.

## 2018-11-13 NOTE — Telephone Encounter (Signed)
Reviewed with Dr. Sabra Heck, ok to proceed with Bilateral Dx MMG and left breast US, if needed.   Call returned to patient, advised as seen above. Patient gave phone to Janett Billow at [facility], verbal order given for bilateral Dx MMG and left breast US, if needed. Order faxed to Janett Billow, patient care coordinator at [facility],  [phone].   Routing to provider for final review. Patient is agreeable to disposition. Will close encounter.

## 2019-01-16 IMAGING — CT CT ANGIO CHEST
2 of 6 series · 19 of 36 positions shown · IV contrast (iopamidol)
Comparison: Radiographs of same day.

CLINICAL DATA: Shortness of breath.

EXAM:
CT ANGIOGRAPHY CHEST WITH CONTRAST
TECHNIQUE: Multidetector CT imaging of the chest was performed using the
standard protocol during bolus administration of intravenous
contrast. Multiplanar CT image reconstructions and MIPs were
obtained to evaluate the vascular anatomy.
CONTRAST:  60 mL [id number] IOPAMIDOL ([id number]) INJECTION 76%

[Series 7: pe thins · axial · 0.88mm/px · z∈[-115,+158]mm · 18 of 433 slices shown]
[im 22/433  lung]
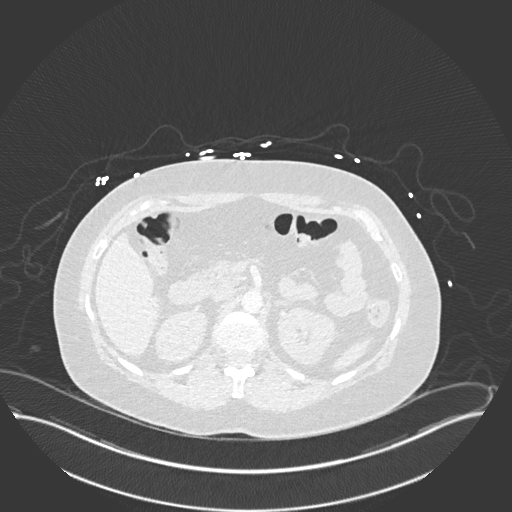
[im 44/433  mediastinal]
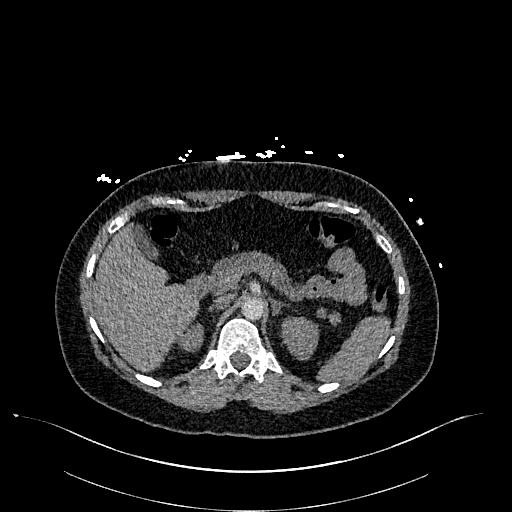
[im 65/433  lung]
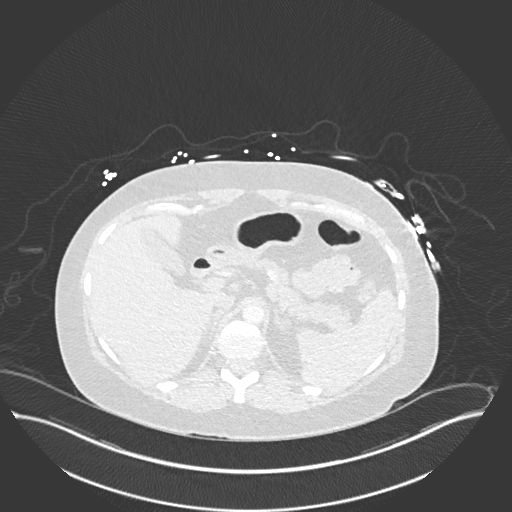
[im 87/433  mediastinal]
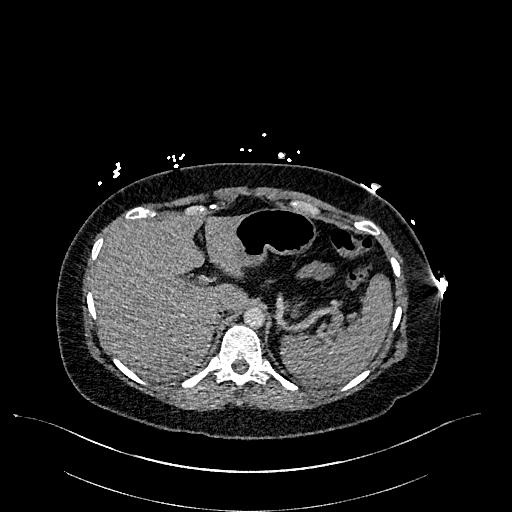
[im 109/433  lung]
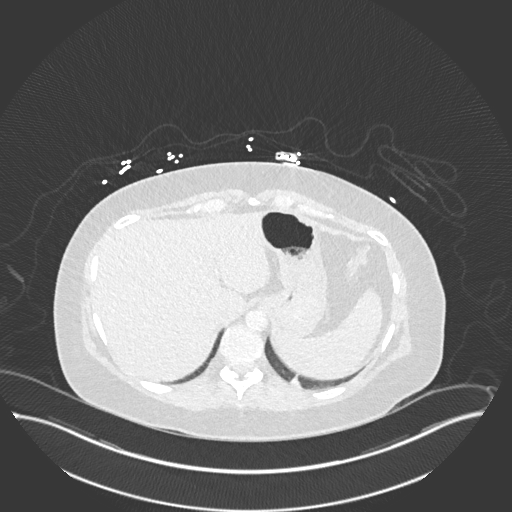
[im 130/433  mediastinal]
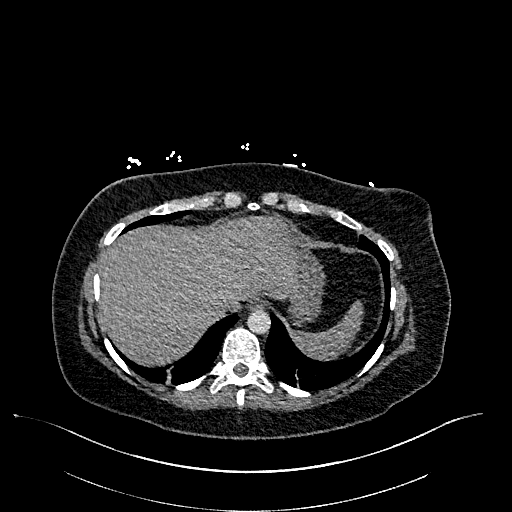
[im 152/433  lung]
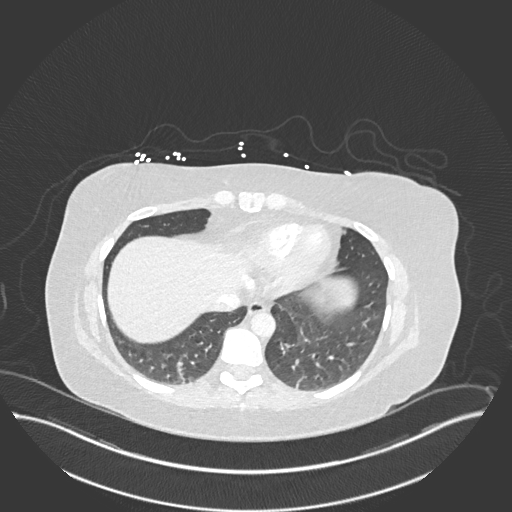
[im 173/433  mediastinal]
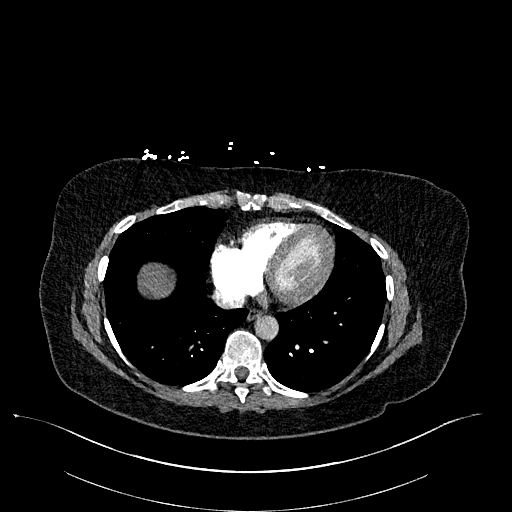
[im 195/433  lung]
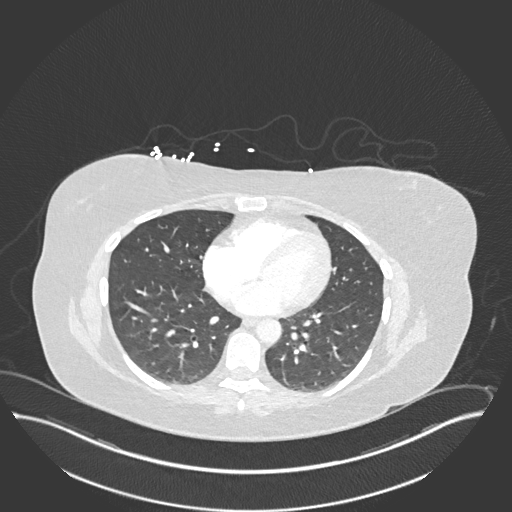
[im 238/433  mediastinal]
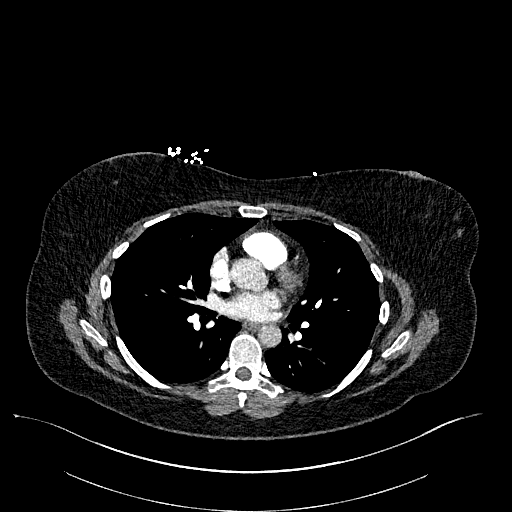
[im 260/433  lung]
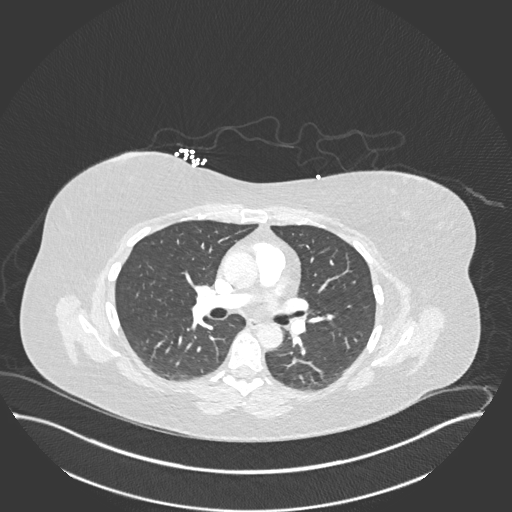
[im 281/433  mediastinal]
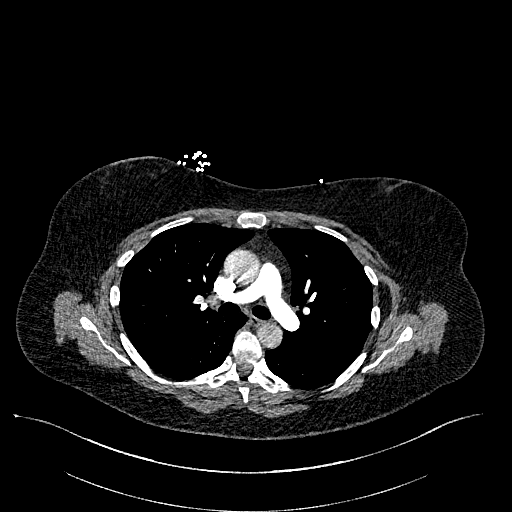
[im 303/433  lung]
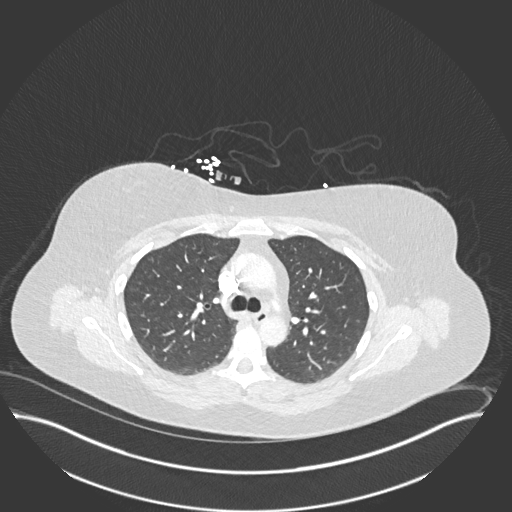
[im 325/433  mediastinal]
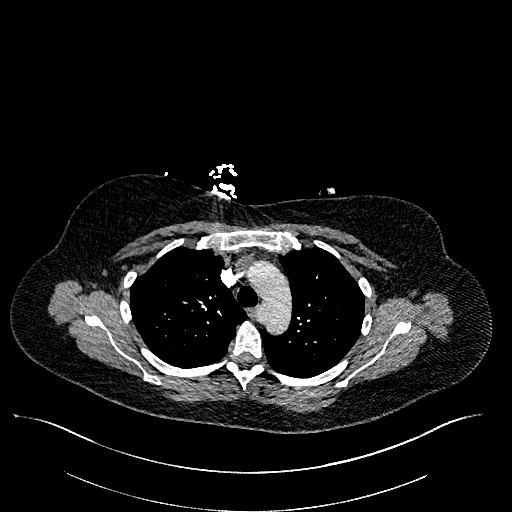
[im 346/433  lung]
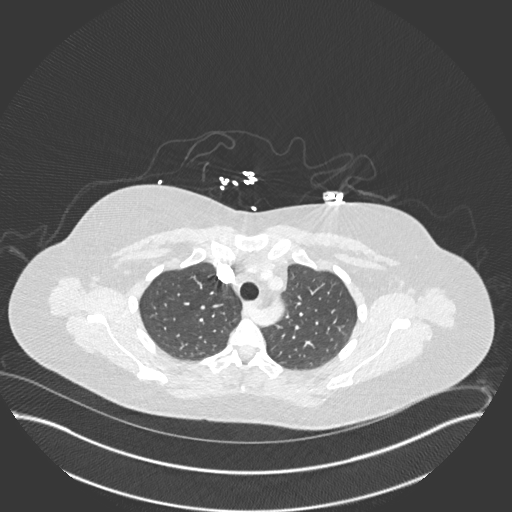
[im 368/433  mediastinal]
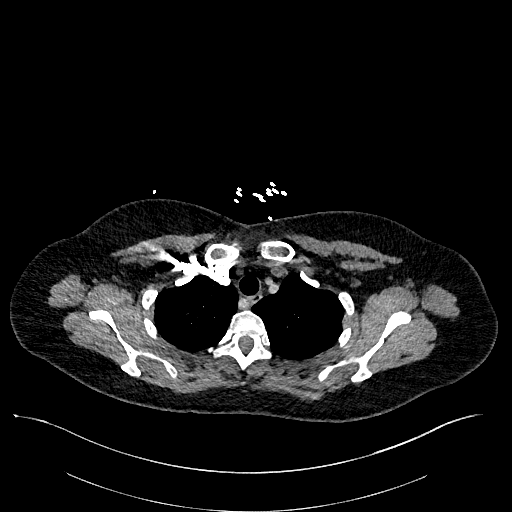
[im 389/433  lung]
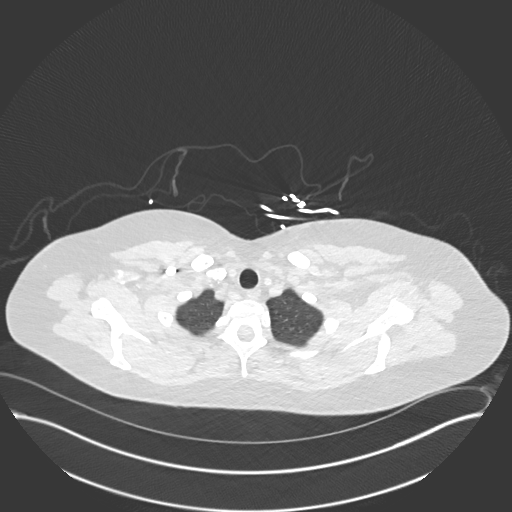
[im 411/433  mediastinal]
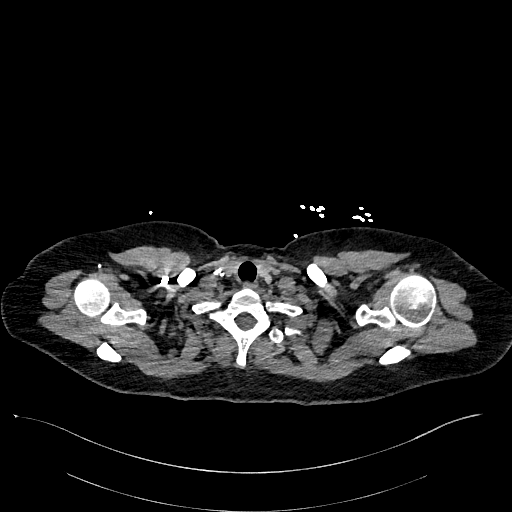

[Series 8: pe 2mm cor · coronal · 0.59mm/px · 1 of 133 slices shown]
[im 67/133  mediastinal]
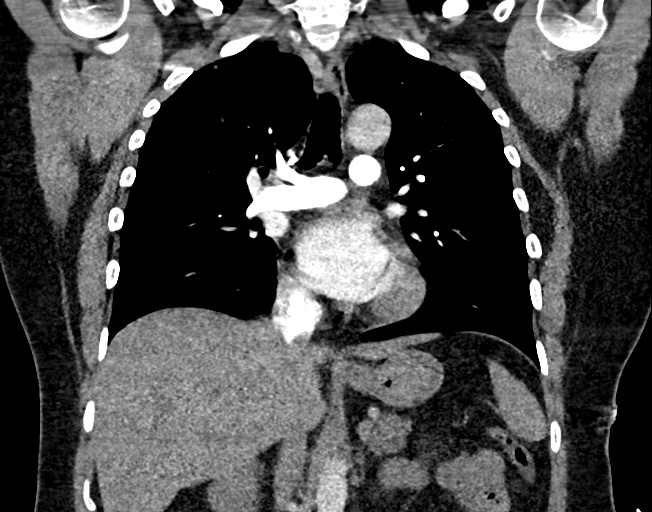

[19 of 36 positions shown; findings below may reference images not displayed]

FINDINGS: Cardiovascular: Satisfactory opacification of the pulmonary arteries
to the segmental level. No evidence of pulmonary embolism. Normal
heart size. No pericardial effusion. Aberrant right subclavian
artery is noted which is congenital anomaly.

Mediastinum/Nodes: No enlarged mediastinal, hilar, or axillary lymph
nodes. Thyroid gland, trachea, and esophagus demonstrate no
significant findings.

Lungs/Pleura: Lungs are clear. No pleural effusion or pneumothorax.

Upper Abdomen: No acute abnormality.

Musculoskeletal: No chest wall abnormality. No acute or significant
osseous findings.

Review of the MIP images confirms the above findings.
IMPRESSION: No definite evidence of pulmonary embolus. No definite abnormality
seen in the chest.

## 2019-01-27 ENCOUNTER — Other Ambulatory Visit: Payer: Self-pay | Admitting: Obstetrics & Gynecology

## 2019-02-05 ENCOUNTER — Other Ambulatory Visit: Payer: Self-pay

## 2019-02-05 ENCOUNTER — Other Ambulatory Visit (INDEPENDENT_AMBULATORY_CARE_PROVIDER_SITE_OTHER): Payer: 59

## 2019-02-05 DIAGNOSIS — E559 Vitamin D deficiency, unspecified: Secondary | ICD-10-CM

## 2019-02-06 LAB — VITAMIN D 25 HYDROXY (VIT D DEFICIENCY, FRACTURES): Vit D, 25-Hydroxy: 36.6 ng/mL (ref 30.0–100.0)

## 2019-10-19 NOTE — Progress Notes (Signed)
44 y.o. G17P2002 Married White or Caucasian female here for annual exam.  Traveled to [place] for spring break.  Travel was not worrisome.  Son has decided to go to [employer].  Has signed up for initial four year commitment.  He finished freshman year in college at [employer].  Second child is a Equities trader in [employer].  He's interested in colleges in [place].  He is interested in medicine.  Denies vaginal bleeding.    Having some low back issues.  X-rays recommended.  Did not go for these.   Would like to consider breast reduction.  Had one years ago prior to children.  Wears DD cup size.  Has neck and back issues.  Uses ice and baclofen intermittently for pain/discomfort.      Patient's last menstrual period was 11/06/2017 (exact date).          Sexually active: Yes.    The current method of family planning is status post hysterectomy.    Exercising: Yes.    gym but not in past month Smoker:  no  Health Maintenance: Pap:  11-10-17 neg HPV HR neg History of abnormal Pap:  no MMG:  11-13-2018 bilateral & left breast birads 1:neg.  Schedule for 11/19/2019. Colonoscopy: 04/11/2017 BMD:   none TDaP:  2014 Pneumonia vaccine(s):  2018 Screening Labs: obtained today.. TSH 2020 and lipids 2019.   reports that she has never smoked. She has never used smokeless tobacco. She reports that she does not drink alcohol and does not use drugs.  Past Medical History:  Diagnosis Date  . Anemia 2019   due to heavy vag bleeding   . Asthma   . Migraine with aura    gets ~ 1-2 a month - controlled with medication   . PONV (postoperative nausea and vomiting)     Past Surgical History:  Procedure Laterality Date  . BREAST REDUCTION SURGERY  1994  . CYSTOSCOPY N/A 12/08/2017   Procedure: CYSTOSCOPY;  Surgeon: Megan Salon, MD;  Location: [facility];  Service: Gynecology;  Laterality: N/A;  . TOTAL LAPAROSCOPIC HYSTERECTOMY WITH SALPINGECTOMY Bilateral 12/08/2017   Procedure: TOTAL LAPAROSCOPIC  HYSTERECTOMY WITH SALPINGECTOMY;  Surgeon: Megan Salon, MD;  Location: [facility];  Service: Gynecology;  Laterality: Bilateral;  specimen weight 233 g    Current Outpatient Medications  Medication Sig Dispense Refill  . albuterol (PROVENTIL HFA) 108 (90 Base) MCG/ACT inhaler Inhale into the lungs daily as needed.    Marland Kitchen albuterol (PROVENTIL) (2.5 MG/3ML) 0.083% nebulizer solution Take 2.5 mg by nebulization every 4 (four) hours as needed for wheezing or shortness of breath.     . Ascorbic Acid (VITAMIN C PO) Take by mouth.    . baclofen (LIORESAL) 10 MG tablet Take 1-2 tablets by mouth 3 (three) times daily as needed (migraines).     . butalbital-acetaminophen-caffeine (FIORICET) 50-325-40 MG tablet Take by mouth.    . Cyanocobalamin (B-12 PO) Take by mouth.    . eletriptan (RELPAX) 40 MG tablet Take 1 tablet by mouth every 2 (two) hours as needed for migraine.     . fluticasone (FLONASE) 50 MCG/ACT nasal spray Place 2 sprays into both nostrils 2 (two) times daily.     . Fremanezumab-vfrm (AJOVY) 225 MG/1.5ML SOSY INJECT 1.5 MLS (225 MG DOSE) INTO THE SKIN EVERY 30 (THIRTY) DAYS.    Marland Kitchen montelukast (SINGULAIR) 10 MG tablet Take 10 mg by mouth at bedtime.     . NURTEC 75 MG  TBDP Take by mouth.    Marland Kitchen VITAMIN D PO Take 5,000 Int'l Units by mouth.    . EPINEPHrine (EPIPEN 2-PAK) 0.3 mg/0.3 mL IJ SOAJ injection Inject 0.3 mg into the muscle once as needed (anaphylaxis).  (Patient not taking: Reported on 10/26/2019)     No current facility-administered medications for this visit.    Family History  Problem Relation Age of Onset  . Hypertension Mother   . Diabetes Mother   . Heart disease Maternal Grandfather   . Osteoporosis Maternal Aunt     Review of Systems  Constitutional: Negative.   HENT: Negative.   Eyes: Negative.   Respiratory: Negative.   Cardiovascular: Negative.   Gastrointestinal: Negative.   Endocrine: Negative.   Genitourinary: Negative.   Musculoskeletal:  Negative.   Skin: Negative.   Allergic/Immunologic: Negative.   Neurological: Negative.   Hematological: Negative.   Psychiatric/Behavioral: Negative.     Exam:   BP 114/72   Pulse 68   Resp 16   Ht 5' 5.25" (1.657 m)   Wt 200 lb (90.7 kg)   LMP 11/06/2017 (Exact Date)   BMI 33.03 kg/m   Height: 5' 5.25" (165.7 cm)  General appearance: alert, cooperative and appears stated age Head: Normocephalic, without obvious abnormality, atraumatic Neck: no adenopathy, supple, symmetrical, trachea midline and thyroid normal to inspection and palpation Lungs: clear to auscultation bilaterally Breasts: normal appearance, no masses or tenderness Heart: regular rate and rhythm Abdomen: soft, non-tender; bowel sounds normal; no masses,  no organomegaly Extremities: extremities normal, atraumatic, no cyanosis or edema Skin: Skin color, texture, turgor normal. No rashes or lesions Lymph nodes: Cervical, supraclavicular, and axillary nodes normal. No abnormal inguinal nodes palpated Neurologic: Grossly normal   Pelvic: External genitalia:  no lesions              Urethra:  normal appearing urethra with no masses, tenderness or lesions              Bartholins and Skenes: normal                 Vagina: normal appearing vagina with normal color and discharge, no lesions              Cervix: absent              Pap taken: No. Bimanual Exam:  Uterus:  uterus absent              Adnexa: no mass, fullness, tenderness               Rectovaginal: Confirms               Anus:  normal sphincter tone, no lesions  Chaperone, Royal Hawthorn, CMA, was present for exam.  A:  Well Woman with normal exam Vit D deficiency H/o menorrhagia and iron deficiency anemia that resolved with TLH Asthma H/o migraine with aura  P:   Mammogram guidelines reviewed.  Doing yearly. pap smear not indicated Colonoscopy UTD CBC, ferritin, FSH and estradiol Colonoscopy 2018 Plastic surgeon name given for possible breast  reduction. return annually or prn

## 2019-10-26 ENCOUNTER — Ambulatory Visit (INDEPENDENT_AMBULATORY_CARE_PROVIDER_SITE_OTHER): Payer: Managed Care, Other (non HMO) | Admitting: Obstetrics & Gynecology

## 2019-10-26 ENCOUNTER — Encounter: Payer: Self-pay | Admitting: Obstetrics & Gynecology

## 2019-10-26 ENCOUNTER — Other Ambulatory Visit: Payer: Self-pay

## 2019-10-26 VITALS — BP 114/72 | HR 68 | Resp 16 | Ht 65.25 in | Wt 200.0 lb

## 2019-10-26 DIAGNOSIS — E559 Vitamin D deficiency, unspecified: Secondary | ICD-10-CM

## 2019-10-26 DIAGNOSIS — Z01419 Encounter for gynecological examination (general) (routine) without abnormal findings: Secondary | ICD-10-CM | POA: Diagnosis not present

## 2019-10-26 DIAGNOSIS — R790 Abnormal level of blood mineral: Secondary | ICD-10-CM | POA: Diagnosis not present

## 2019-10-26 DIAGNOSIS — R232 Flushing: Secondary | ICD-10-CM

## 2019-10-26 NOTE — Patient Instructions (Signed)
Dr. Crissie Reese  Phone: [phone]  [address], [place], [place] [address]

## 2019-10-27 LAB — CBC
Hematocrit: 44.1 % (ref 34.0–46.6)
Hemoglobin: 14.2 g/dL (ref 11.1–15.9)
MCH: 29.8 pg (ref 26.6–33.0)
MCHC: 32.2 g/dL (ref 31.5–35.7)
MCV: 93 fL (ref 79–97)
Platelets: 270 10*3/uL (ref 150–450)
RBC: 4.76 x10E6/uL (ref 3.77–5.28)
RDW: 12.5 % (ref 11.7–15.4)
WBC: 8 10*3/uL (ref 3.4–10.8)

## 2019-10-27 LAB — ESTRADIOL: Estradiol: 85.5 pg/mL

## 2019-10-27 LAB — FOLLICLE STIMULATING HORMONE: FSH: 4.1 m[IU]/mL

## 2019-10-27 LAB — VITAMIN D 25 HYDROXY (VIT D DEFICIENCY, FRACTURES): Vit D, 25-Hydroxy: 37.6 ng/mL (ref 30.0–100.0)

## 2019-10-27 LAB — FERRITIN: Ferritin: 92 ng/mL (ref 15–150)

## 2019-11-12 ENCOUNTER — Ambulatory Visit: Payer: 59 | Admitting: Obstetrics & Gynecology

## 2019-11-30 ENCOUNTER — Encounter: Payer: Self-pay | Admitting: Obstetrics & Gynecology

## 2020-12-15 ENCOUNTER — Ambulatory Visit: Payer: Managed Care, Other (non HMO)

## 2021-04-12 ENCOUNTER — Encounter (HOSPITAL_BASED_OUTPATIENT_CLINIC_OR_DEPARTMENT_OTHER): Payer: Self-pay

## 2021-04-12 ENCOUNTER — Other Ambulatory Visit: Payer: Self-pay

## 2021-04-12 ENCOUNTER — Emergency Department (HOSPITAL_BASED_OUTPATIENT_CLINIC_OR_DEPARTMENT_OTHER): Payer: 59

## 2021-04-12 ENCOUNTER — Emergency Department (HOSPITAL_BASED_OUTPATIENT_CLINIC_OR_DEPARTMENT_OTHER)
Admission: EM | Admit: 2021-04-12 | Discharge: 2021-04-12 | Disposition: A | Discharge status: Home / Self Care | Payer: 59 | Attending: Emergency Medicine | Admitting: Emergency Medicine

## 2021-04-12 ENCOUNTER — Encounter: Payer: Self-pay | Admitting: Family

## 2021-04-12 DIAGNOSIS — Z7952 Long term (current) use of systemic steroids: Secondary | ICD-10-CM | POA: Diagnosis not present

## 2021-04-12 DIAGNOSIS — J45909 Unspecified asthma, uncomplicated: Secondary | ICD-10-CM | POA: Diagnosis not present

## 2021-04-12 DIAGNOSIS — R102 Pelvic and perineal pain: Secondary | ICD-10-CM

## 2021-04-12 DIAGNOSIS — R1031 Right lower quadrant pain: Secondary | ICD-10-CM | POA: Insufficient documentation

## 2021-04-12 LAB — CBC
HCT: 44.4 % (ref 36.0–46.0)
Hemoglobin: 14.4 g/dL (ref 12.0–15.0)
MCH: 29.4 pg (ref 26.0–34.0)
MCHC: 32.4 g/dL (ref 30.0–36.0)
MCV: 90.8 fL (ref 80.0–100.0)
Platelets: 302 10*3/uL (ref 150–400)
RBC: 4.89 MIL/uL (ref 3.87–5.11)
RDW: 12.7 % (ref 11.5–15.5)
WBC: 8.7 10*3/uL (ref 4.0–10.5)
nRBC: 0 % (ref 0.0–0.2)

## 2021-04-12 LAB — URINALYSIS, ROUTINE W REFLEX MICROSCOPIC
Bilirubin Urine: NEGATIVE
Glucose, UA: NEGATIVE mg/dL
Hgb urine dipstick: NEGATIVE
Ketones, ur: NEGATIVE mg/dL
Leukocytes,Ua: NEGATIVE
Nitrite: NEGATIVE
Protein, ur: NEGATIVE mg/dL
Specific Gravity, Urine: 1.026 (ref 1.005–1.030)
pH: 5.5 (ref 5.0–8.0)

## 2021-04-12 LAB — BASIC METABOLIC PANEL
Anion gap: 8 (ref 5–15)
BUN: 11 mg/dL (ref 6–20)
CO2: 29 mmol/L (ref 22–32)
Calcium: 9.9 mg/dL (ref 8.9–10.3)
Chloride: 101 mmol/L (ref 98–111)
Creatinine, Ser: 0.78 mg/dL (ref 0.44–1.00)
GFR, Estimated: >60 mL/min (ref >60)
Glucose, Bld: 92 mg/dL (ref 70–99)
Potassium: 4 mmol/L (ref 3.5–5.1)
Sodium: 138 mmol/L (ref 135–145)

## 2021-04-12 MED ORDER — IOHEXOL 300 MG/ML  SOLN
100.0000 mL | Freq: Once | INTRAMUSCULAR | Status: AC | PRN
  Administered 2021-04-12: 16:00:00 100 mL via INTRAVENOUS

## 2021-04-12 MED ORDER — OXYCODONE HCL 5 MG PO TABS
5.0000 mg | ORAL_TABLET | Freq: Once | ORAL | Status: AC
  Administered 2021-04-12: 14:00:00 5 mg via ORAL
  Filled 2021-04-12: qty 1

## 2021-04-12 MED ORDER — ACETAMINOPHEN 325 MG PO TABS
650.0000 mg | ORAL_TABLET | Freq: Once | ORAL | Status: AC
  Administered 2021-04-12: 13:00:00 650 mg via ORAL
  Filled 2021-04-12: qty 2

## 2021-04-12 NOTE — ED Provider Notes (Signed)
[place] EMERGENCY DEPT Provider Note   CSN: [id number] Arrival date & time: 04/12/21  1135     History Chief Complaint  Patient presents with   Flank Pain    Stacy Ochoa is a 45 y.o. female.    Patient presents today with complaints of right lower quadrant abdominal pain.  She states that yesterday morning, she started experiencing sudden onset sharp right lower quadrant pain that radiates into her right lower back.  This sensation was intermittent all day.  She states that overnight, the pain became constant and is gradually worsened.  She denies injury to the area.  Denies dysuria or hematuria.  She denies constipation, diarrhea, nausea, or vomiting.  She has a history of a hysterectomy but still does have her ovaries.  She has had ovarian cyst that ruptured when she was 49, however she is says that this does not feel the same.   Flank Pain Associated symptoms include abdominal pain.      Past Medical History:  Diagnosis Date   Asthma    History of anemia 2019   due to heavy vag bleeding    Migraine with aura    gets ~ 1-2 a month - controlled with medication    PONV (postoperative nausea and vomiting)     Patient Active Problem List   Diagnosis Date Noted   Vitamin D deficiency 06/24/2018   IDA (iron deficiency anemia) 09/18/2017   Migraines 09/05/2017   Mild intermittent asthma with acute exacerbation 06/23/2015    Past Surgical History:  Procedure Laterality Date   [place] N/A 12/08/2017   Procedure: CYSTOSCOPY;  Surgeon: Megan Salon, MD;  Location: [facility];  Service: Gynecology;  Laterality: N/A;   TOTAL LAPAROSCOPIC HYSTERECTOMY WITH SALPINGECTOMY Bilateral 12/08/2017   Procedure: TOTAL LAPAROSCOPIC HYSTERECTOMY WITH SALPINGECTOMY;  Surgeon: Megan Salon, MD;  Location: [facility];  Service: Gynecology;  Laterality: Bilateral;  specimen weight 233 g     OB History      Gravida  2   Para  2   Term  2   Preterm      AB      Living  2      SAB      IAB      Ectopic      Multiple      Live Births  2           Family History  Problem Relation Age of Onset   Hypertension Mother    Diabetes Mother    Heart disease Maternal Grandfather    Osteoporosis Maternal Aunt    Colon polyps Brother 57       precancerous polyps    Social History   Tobacco Use   Smoking status: Never   Smokeless tobacco: Never  Vaping Use   Vaping Use: Never used  Substance Use Topics   Alcohol use: No   Drug use: No    Home Medications Prior to Admission medications   Medication Sig Start Date End Date Taking? Authorizing Provider  albuterol (PROVENTIL) (2.5 MG/3ML) 0.083% nebulizer solution Take 2.5 mg by nebulization every 4 (four) hours as needed for wheezing or shortness of breath.  04/25/16  Yes [provider]  albuterol (VENTOLIN HFA) 108 (90 Base) MCG/ACT inhaler Inhale into the lungs daily as needed. 09/19/16 04/12/21 Yes [provider]  Ascorbic Acid (VITAMIN C PO) Take by mouth.   Yes [provider]  Cyanocobalamin (B-12 PO) Take by mouth.   Yes [provider]  fluticasone (FLONASE) 50 MCG/ACT nasal spray Place 2 sprays into both nostrils 2 (two) times daily.  09/02/17  Yes [provider]  NURTEC 75 MG TBDP Take by mouth. 08/03/19  Yes [provider]  QULIPTA 60 MG TABS Take 1 tablet by mouth daily. 03/29/21  Yes [provider]  VITAMIN D PO Take 5,000 Int'l Units by mouth.   Yes [provider]  EPINEPHrine (EPIPEN 2-PAK) 0.3 mg/0.3 mL IJ SOAJ injection Inject 0.3 mg into the muscle once as needed (anaphylaxis).  Patient not taking: Reported on 10/26/2019 02/05/15   [provider]    Allergies    Aspirin, Nsaids, and Shellfish allergy  Review of Systems   Review of Systems  Constitutional:  Negative for fever.  Gastrointestinal:  Positive for  abdominal pain. Negative for blood in stool, constipation, diarrhea, nausea and vomiting.  Genitourinary:  Negative for dysuria, flank pain, frequency, hematuria, vaginal bleeding and vaginal discharge.  Musculoskeletal:  Negative for back pain.  All other systems reviewed and are negative.  Physical Exam Updated Vital Signs BP 122/78    Pulse 76    Temp 98.4 F (36.9 C) (Oral)    Resp 18    Ht 5\' 6"  (1.676 m)    Wt 88.9 kg    LMP 11/06/2017 (Exact Date)    SpO2 98%    BMI 31.64 kg/m   Physical Exam Vitals and nursing note reviewed.  Constitutional:      General: She is not in acute distress.    Appearance: Normal appearance. She is not ill-appearing, toxic-appearing or diaphoretic.  HENT:     Head: Normocephalic and atraumatic.     Nose: No nasal deformity.     Mouth/Throat:     Lips: Pink. No lesions.     Mouth: No injury, lacerations, oral lesions or angioedema.     Pharynx: Uvula midline. No pharyngeal swelling or uvula swelling.  Eyes:     General: Gaze aligned appropriately. No scleral icterus.       Right eye: No discharge.        Left eye: No discharge.     Conjunctiva/sclera: Conjunctivae normal.     Right eye: Right conjunctiva is not injected. No exudate or hemorrhage.    Left eye: Left conjunctiva is not injected. No exudate or hemorrhage. Cardiovascular:     Rate and Rhythm: Normal rate and regular rhythm.     Pulses: Normal pulses.          Radial pulses are 2+ on the right side and 2+ on the left side.       Dorsalis pedis pulses are 2+ on the right side and 2+ on the left side.     Heart sounds: Normal heart sounds, S1 normal and S2 normal. Heart sounds not distant. No murmur heard.   No friction rub. No gallop. No S3 or S4 sounds.  Pulmonary:     Effort: Pulmonary effort is normal. No accessory muscle usage or respiratory distress.     Breath sounds: Normal breath sounds. No stridor. No wheezing, rhonchi or rales.  Chest:     Chest wall: No tenderness.   Abdominal:     General: Abdomen is flat. There is no distension.     Palpations: Abdomen is soft. There is no mass or pulsatile mass.     Tenderness: There is abdominal tenderness. There is no right CVA  tenderness, left CVA tenderness, guarding or rebound.     Comments: Patient with minimal tenderness to the right lower quadrant.  No rebound.  Pain is localized to this area.  No Reproducible right flank pain  Musculoskeletal:     Right lower leg: No edema.     Left lower leg: No edema.  Skin:    General: Skin is warm and dry.     Coloration: Skin is not jaundiced or pale.     Findings: No bruising, erythema, lesion or rash.  Neurological:     General: No focal deficit present.     Mental Status: She is alert and oriented to person, place, and time.     GCS: GCS eye subscore is 4. GCS verbal subscore is 5. GCS motor subscore is 6.  Psychiatric:        Mood and Affect: Mood normal.        Behavior: Behavior normal. Behavior is cooperative.    ED Results / Procedures / Treatments   Labs (all labs ordered are listed, but only abnormal results are displayed) Labs Reviewed  URINALYSIS, ROUTINE W REFLEX MICROSCOPIC  BASIC METABOLIC PANEL  CBC    EKG None  Radiology CT Abdomen Pelvis W Contrast  Result Date: 04/12/2021 CLINICAL DATA:  Stabbing right flank pain EXAM: CT ABDOMEN AND PELVIS WITH CONTRAST TECHNIQUE: Multidetector CT imaging of the abdomen and pelvis was performed using the standard protocol following bolus administration of intravenous contrast. COMPARISON:  Same-day pelvic ultrasound FINDINGS: Lower chest: The lung bases are clear. The liver and gallbladder are unremarkable. There is no biliary ductal dilatation. Hepatobiliary: Unremarkable. Pancreas: Unremarkable. Spleen: Unremarkable. Adrenals/Urinary Tract: The adrenals are unremarkable. The kidneys are unremarkable, with no focal lesion, stone, hydronephrosis, or hydroureter. The bladder is unremarkable.  Stomach/Bowel: The stomach is unremarkable. There is no evidence of bowel obstruction. There is no abnormal bowel wall thickening or inflammatory change. Appendix is unremarkable. Vascular/Lymphatic: The abdominal aorta is normal in course and caliber. The major branch vessels are patent. The main portal and splenic veins are patent. There is no abdominal or pelvic lymphadenopathy. Reproductive: The uterus is surgically absent. Small bilateral adnexal cysts are seen, better evaluated on the same-day pelvic ultrasound. Other: There is no ascites or free air. Musculoskeletal: There is no acute osseous abnormality or aggressive osseous lesion. IMPRESSION: No acute findings in the abdomen or pelvis to explain the patient's pain. Electronically Signed   By: Valetta Mole M.D.   On: 04/12/2021 16:24   US PELVIC COMPLETE W TRANSVAGINAL AND TORSION R/O  Result Date: 04/12/2021 CLINICAL DATA:  Right flank pain EXAM: TRANSABDOMINAL AND TRANSVAGINAL ULTRASOUND OF PELVIS DOPPLER ULTRASOUND OF OVARIES TECHNIQUE: Both transabdominal and transvaginal ultrasound examinations of the pelvis were performed. Transabdominal technique was performed for global imaging of the pelvis including uterus, ovaries, adnexal regions, and pelvic cul-de-sac. It was necessary to proceed with endovaginal exam following the transabdominal exam to visualize the ovaries and adnexa. Color and duplex Doppler ultrasound was utilized to evaluate blood flow to the ovaries. COMPARISON:  09/09/2017 FINDINGS: Uterus Measurements: Prior hysterectomy Endometrium Thickness: Prior hysterectomy. Right ovary Measurements: 3.4 x 1.9 x 1.9 cm = volume: 6.3 mL. 1.4 cm dominant follicle. No adnexal mass. Left ovary Measurements: 1.5 x 2.5 x 1.8 cm = volume: 3.7 mL. Normal appearance/no adnexal mass. Pulsed Doppler evaluation of both ovaries demonstrates normal low-resistance arterial and venous waveforms. Other findings No abnormal free fluid. IMPRESSION: No acute  findings or significant abnormality. No evidence of  ovarian mass or torsion. Electronically Signed   By: Rolm Baptise M.D.   On: 04/12/2021 14:59    Procedures Procedures   Medications Ordered in ED Medications  acetaminophen (TYLENOL) tablet 650 mg (650 mg Oral Given 04/12/21 1245)  oxyCODONE (Oxy IR/ROXICODONE) immediate release tablet 5 mg (5 mg Oral Given 04/12/21 1427)  iohexol (OMNIPAQUE) 300 MG/ML solution 100 mL (100 mLs Intravenous Contrast Given 04/12/21 1615)    ED Course  I have reviewed the triage vital signs and the nursing notes.  Pertinent labs & imaging results that were available during my care of the patient were reviewed by me and considered in my medical decision making (see chart for details).    MDM Rules/Calculators/A&P                          This is a 45 y.o. female with a PMH of asthma and previous ovarian cyst rupture who presents to the ED with 2 days of right lower quadrant pain that has been gradually worsening.  Vitals: Stable  Exam: Minimal right lower quadrant abdominal tenderness with no guarding or rigidity.  No right CVA tenderness.  Initial Impression: No urinary symptoms or right flank pain, so low suspicion for Pil[place] or kidney stone.  No GI symptoms so doubt enteritis or constipation. No vaginal symptoms so unlikely to be a GU infection. Patient has no other systemic symptoms and pain is localized to right lower quadrant, so appendicitis is unlikely, however is not ruled out.  I feel as though this is likely an ovarian cause. Doubt ectopic due to previous hysterectomy.  Want to rule out ovarian torsion or cyst.  If ultrasound is unremarkable, will likely proceed with CT abdomen pelvis.   I personally reviewed all laboratory work and imaging. Abnormal results outlined below. Labs all returned within normal limits. Pelvic Ultrasound negative for cyst or torsion CT abdomen pelvis negative for acute process.   Events/Interventions: Pain treated  with Oxycodone and Tylenol  On reassessment, pain is improved.  Impression: Unclear cause for abdominal pain. Could be MSK. Patients symptoms are better and negative work up.   Dispo Plan: Recommend supportive treatment at home. Return precautions provided. Close follow up with PCP in next week for reevaluation.   Portions of this note were generated with [employer]. Dictation errors may occur despite best attempts at proofreading.   Final Clinical Impression(s) / ED Diagnoses Final diagnoses:  Right lower quadrant abdominal pain    Rx / DC Orders ED Discharge Orders     None        Adolphus Birchwood, PA-C 04/12/21 1941    Sherwood Gambler, MD 04/13/21 [phone]

## 2021-04-12 NOTE — Discharge Instructions (Signed)

## 2021-04-12 NOTE — ED Triage Notes (Signed)
Pt c/o sudden onset of sharp stabbing R flank pain yesterday morning. Pt describes yesterday as "waves of pain." Today pain is constant and described as dull. Pt denies injury or urinary symptoms. Pt has a hx of ruptured ovarian cyst.

## 2021-11-20 DIAGNOSIS — U071 COVID-19: Secondary | ICD-10-CM

## 2021-11-20 HISTORY — DX: COVID-19: U07.1

## 2021-12-18 ENCOUNTER — Emergency Department (HOSPITAL_BASED_OUTPATIENT_CLINIC_OR_DEPARTMENT_OTHER)
Admission: EM | Admit: 2021-12-18 | Discharge: 2021-12-18 | Disposition: A | Discharge status: Home / Self Care | Payer: 59 | Attending: Emergency Medicine | Admitting: Emergency Medicine

## 2021-12-18 ENCOUNTER — Emergency Department (HOSPITAL_BASED_OUTPATIENT_CLINIC_OR_DEPARTMENT_OTHER): Payer: 59 | Admitting: Radiology

## 2021-12-18 ENCOUNTER — Other Ambulatory Visit: Payer: Self-pay

## 2021-12-18 ENCOUNTER — Encounter (HOSPITAL_BASED_OUTPATIENT_CLINIC_OR_DEPARTMENT_OTHER): Payer: Self-pay

## 2021-12-18 DIAGNOSIS — J45901 Unspecified asthma with (acute) exacerbation: Secondary | ICD-10-CM | POA: Insufficient documentation

## 2021-12-18 DIAGNOSIS — R0602 Shortness of breath: Secondary | ICD-10-CM | POA: Diagnosis present

## 2021-12-18 LAB — BASIC METABOLIC PANEL
Anion gap: 13 (ref 5–15)
BUN: 21 mg/dL — ABNORMAL HIGH (ref 6–20)
CO2: 23 mmol/L (ref 22–32)
Calcium: 9.6 mg/dL (ref 8.9–10.3)
Chloride: 101 mmol/L (ref 98–111)
Creatinine, Ser: 0.9 mg/dL (ref 0.44–1.00)
GFR, Estimated: >60 mL/min (ref >60)
Glucose, Bld: 94 mg/dL (ref 70–99)
Potassium: 4 mmol/L (ref 3.5–5.1)
Sodium: 137 mmol/L (ref 135–145)

## 2021-12-18 LAB — CBC
HCT: 44.4 % (ref 36.0–46.0)
Hemoglobin: 15.4 g/dL — ABNORMAL HIGH (ref 12.0–15.0)
MCH: 29.8 pg (ref 26.0–34.0)
MCHC: 34.7 g/dL (ref 30.0–36.0)
MCV: 85.9 fL (ref 80.0–100.0)
Platelets: 426 10*3/uL — ABNORMAL HIGH (ref 150–400)
RBC: 5.17 MIL/uL — ABNORMAL HIGH (ref 3.87–5.11)
RDW: 12.2 % (ref 11.5–15.5)
WBC: 15.1 10*3/uL — ABNORMAL HIGH (ref 4.0–10.5)
nRBC: 0 % (ref 0.0–0.2)

## 2021-12-18 LAB — TROPONIN I (HIGH SENSITIVITY)
Troponin I (High Sensitivity): 4 ng/L (ref <18)
Troponin I (High Sensitivity): 2 ng/L (ref <18)

## 2021-12-18 LAB — HCG, SERUM, QUALITATIVE: Preg, Serum: NEGATIVE

## 2021-12-18 MED ORDER — DEXAMETHASONE SODIUM PHOSPHATE 10 MG/ML IJ SOLN
6.0000 mg | Freq: Once | INTRAMUSCULAR | Status: AC
  Administered 2021-12-18: 6 mg via INTRAVENOUS
  Filled 2021-12-18: qty 1

## 2021-12-18 MED ORDER — IPRATROPIUM-ALBUTEROL 0.5-2.5 (3) MG/3ML IN SOLN
3.0000 mL | RESPIRATORY_TRACT | 0 refills | Status: AC | PRN
Start: 2021-12-18 — End: 2022-10-29

## 2021-12-18 MED ORDER — PREDNISONE 10 MG PO TABS: ORAL_TABLET | ORAL | 0 refills | Status: DC

## 2021-12-18 MED ORDER — ACETAMINOPHEN 325 MG PO TABS
650.0000 mg | ORAL_TABLET | Freq: Once | ORAL | Status: AC
  Administered 2021-12-18: 650 mg via ORAL
  Filled 2021-12-18: qty 2

## 2021-12-18 MED ORDER — IPRATROPIUM-ALBUTEROL 0.5-2.5 (3) MG/3ML IN SOLN
3.0000 mL | RESPIRATORY_TRACT | Status: AC
  Administered 2021-12-18 (×2): 3 mL via RESPIRATORY_TRACT
  Filled 2021-12-18: qty 6

## 2021-12-18 MED ORDER — ALBUTEROL SULFATE HFA 108 (90 BASE) MCG/ACT IN AERS: 1.0000 | INHALATION_SPRAY | Freq: Four times a day (QID) | RESPIRATORY_TRACT | 0 refills | Status: DC | PRN

## 2021-12-18 NOTE — ED Notes (Signed)
Patient given discharge instructions. Questions were answered. Patient verbalized understanding of discharge instructions and care at home.  Discharged with family 

## 2021-12-18 NOTE — ED Provider Notes (Signed)
[place] EMERGENCY DEPT Provider Note   CSN: [id number] Arrival date & time: 12/18/21  1441     History Chief Complaint  Patient presents with   Shortness of Breath    HPI Stacy Ochoa is a 46 y.o. female presenting for shortness of breath.  Patient states that she has a history of asthma and was diagnosed with COVID 7 days prior to arrival.  She completed a course of Paxlovid and a prednisone oral pack that finished yesterday.  She states that she has felt her shortness of breath worsening over the past 2 days as the steroid dose is decreased.  She endorses subjective fevers without chills.  She endorses cough without productive sputum.  She denies any chest pain at this time. Denies any recent travel or hormone utilization.  Patient without any family or personal history of blood clotting..   Patient's recorded medical, surgical, social, medication list and allergies were reviewed in the Snapshot window as part of the initial history.   Review of Systems   Review of Systems  Constitutional:  Negative for chills and fever.  HENT:  Negative for ear pain and sore throat.   Eyes:  Negative for pain and visual disturbance.  Respiratory:  Positive for chest tightness, shortness of breath and wheezing. Negative for cough.   Cardiovascular:  Negative for chest pain and palpitations.  Gastrointestinal:  Negative for abdominal pain and vomiting.  Genitourinary:  Negative for dysuria and hematuria.  Musculoskeletal:  Negative for arthralgias and back pain.  Skin:  Negative for color change and rash.  Neurological:  Negative for seizures and syncope.  All other systems reviewed and are negative.   Physical Exam Updated Vital Signs BP 126/81   Pulse 72   Temp 98.4 F (36.9 C) (Oral)   Resp 15   Ht '5\' 6"'$  (1.676 m)   Wt 88.5 kg   LMP 11/06/2017 (Exact Date)   SpO2 100%   BMI 31.47 kg/m  Physical Exam Vitals and nursing note reviewed.  Constitutional:       General: She is not in acute distress.    Appearance: She is well-developed.  HENT:     Head: Normocephalic and atraumatic.  Eyes:     Conjunctiva/sclera: Conjunctivae normal.  Cardiovascular:     Rate and Rhythm: Normal rate and regular rhythm.     Heart sounds: No murmur heard. Pulmonary:     Effort: Pulmonary effort is normal. No respiratory distress.     Breath sounds: Decreased breath sounds and wheezing present.  Abdominal:     General: There is no distension.     Palpations: Abdomen is soft.     Tenderness: There is no abdominal tenderness. There is no right CVA tenderness or left CVA tenderness.  Musculoskeletal:        General: No swelling or tenderness. Normal range of motion.     Cervical back: Neck supple.  Skin:    General: Skin is warm and dry.  Neurological:     General: No focal deficit present.     Mental Status: She is alert and oriented to person, place, and time. Mental status is at baseline.     Cranial Nerves: No cranial nerve deficit.      ED Course/ Medical Decision Making/ A&P    Procedures Procedures   Medications Ordered in ED Medications  ipratropium-albuterol (DUONEB) 0.5-2.5 (3) MG/3ML nebulizer solution 3 mL (3 mLs Nebulization Given 12/18/21 1705)  dexamethasone (DECADRON) injection 6 mg (6 mg Intravenous  Given 12/18/21 1655)  acetaminophen (TYLENOL) tablet 650 mg (650 mg Oral Given 12/18/21 1655)    Medical Decision Making:    Stacy Ochoa is a 46 y.o. female who presented to the ED today with shortness of breath detailed above.     Patient's presentation is complicated by their history of multiple comorbid medical problems, asthma, elevated BMI.  Patient placed on continuous vitals and telemetry monitoring while in ED which was reviewed periodically.   Complete initial physical exam performed, notably the patient  was with decreased breath sounds and mild residual wheezing appreciated on expiration.      Reviewed and confirmed nursing  documentation for past medical history, family history, social history.    Initial Assessment:   With the patient's presentation of shortness of breath in the setting of asthma and COVID, most likely diagnosis is asthma exacerbation secondary to ongoing COVID infection. Other diagnoses were considered including (but not limited to) pneumonia, pulmonary embolism, ACS, pneumothorax. These are considered less likely due to history of present illness and physical exam findings.   This is most consistent with an acute life/limb threatening illness complicated by underlying chronic conditions. In particular, concerning pulmonary embolism: Patient denies hormone usage including birth control, leg pain or swelling, recent travel and is otherwise PERC negative. Initial Plan:  Therapeutic treatment with DuoNeb's x2, dexamethasone IV with plan for reassessment to ensure symptomatic improvement Screening labs including CBC and Metabolic panel to evaluate for infectious or metabolic etiology of disease.  CXR to evaluate for structural/infectious intrathoracic pathology.  EKG to evaluate for cardiac pathology. Objective evaluation as below reviewed with plan for close reassessment  Initial Study Results:   Laboratory  All laboratory results reviewed without evidence of clinically relevant pathology.   Mild leukocytosis appreciated likely secondary patient's known ongoing infection. EKG EKG was reviewed independently. Rate, rhythm, axis, intervals all examined and without medically relevant abnormality. ST segments without concerns for elevations.    Radiology  All images reviewed independently. Agree with radiology report at this time.   DG Chest 2 View  Result Date: 12/18/2021 CLINICAL DATA:  COVID + on 8/24, worsening SOB, body aches, weakness, chills, and cough that started today EXAM: CHEST - 2 VIEW COMPARISON:  12/12/2017 FINDINGS: Lungs are clear. Heart size and mediastinal contours are within normal  limits. No effusion.  No pneumothorax. Visualized bones unremarkable. IMPRESSION: No acute cardiopulmonary disease. Electronically Signed   By: Lucrezia Europe M.D.   On: 12/18/2021 15:08      Final Assessment and Plan:   After multiple hours of evaluation in the emergency department, respiratory therapies, patient has clinically improved.  She has endorsed symptomatic improvement.  Labs remain grossly reassuring and x-ray without signs of comorbid pneumonia.  We will plan for patient to continue her steroid burst given her ongoing asthma exacerbation and will refill outpatient bronchodilators to assist patient in tolerating outpatient care management.  Oxygen saturations 100% on multiple reassessments and patient is currently stable for outpatient care and management with strict return precautions regarding clinical deterioration over the next 72 hours reinforced.  Patient expressed understanding is discharged with no acute events.     Clinical Impression:  1. Asthma with acute exacerbation, unspecified asthma severity, unspecified whether persistent      Discharge   Final Clinical Impression(s) / ED Diagnoses Final diagnoses:  Asthma with acute exacerbation, unspecified asthma severity, unspecified whether persistent    Rx / DC Orders ED Discharge Orders  Ordered    albuterol (VENTOLIN HFA) 108 (90 Base) MCG/ACT inhaler  Every 6 hours PRN        12/18/21 1900    ipratropium-albuterol (DUONEB) 0.5-2.5 (3) MG/3ML SOLN  Every 4 hours PRN        12/18/21 1900    predniSONE (DELTASONE) 10 MG tablet  Daily        12/18/21 1900              Tretha Sciara, MD 12/18/21 1900

## 2021-12-18 NOTE — ED Triage Notes (Signed)
Patient here POV from Home.  Endorses SOB since Yesterday. Began having Symptoms such as Fever, Chills, Congestion since Tuesday. Tested Positive for COVID-19 on Thursday.   Mid Chest Pain as well. No N/V. Some Diarrhea.   SOB During Triage. A&Ox4. GCS 15. Ambulatory.

## 2021-12-20 ENCOUNTER — Other Ambulatory Visit: Payer: Self-pay

## 2021-12-20 ENCOUNTER — Encounter (HOSPITAL_COMMUNITY): Payer: Self-pay

## 2021-12-20 ENCOUNTER — Emergency Department (HOSPITAL_BASED_OUTPATIENT_CLINIC_OR_DEPARTMENT_OTHER): Payer: 59

## 2021-12-20 ENCOUNTER — Encounter (HOSPITAL_BASED_OUTPATIENT_CLINIC_OR_DEPARTMENT_OTHER): Payer: Self-pay | Admitting: Emergency Medicine

## 2021-12-20 ENCOUNTER — Encounter: Payer: Self-pay | Admitting: Family

## 2021-12-20 ENCOUNTER — Observation Stay (HOSPITAL_BASED_OUTPATIENT_CLINIC_OR_DEPARTMENT_OTHER)
Admission: EM | Admit: 2021-12-20 | Discharge: 2021-12-22 | Disposition: A | Discharge status: Home / Self Care | Payer: 59 | Attending: Internal Medicine | Admitting: Internal Medicine

## 2021-12-20 DIAGNOSIS — R0603 Acute respiratory distress: Secondary | ICD-10-CM

## 2021-12-20 DIAGNOSIS — R531 Weakness: Secondary | ICD-10-CM | POA: Insufficient documentation

## 2021-12-20 DIAGNOSIS — J9600 Acute respiratory failure, unspecified whether with hypoxia or hypercapnia: Principal | ICD-10-CM | POA: Insufficient documentation

## 2021-12-20 DIAGNOSIS — Z79899 Other long term (current) drug therapy: Secondary | ICD-10-CM | POA: Diagnosis not present

## 2021-12-20 DIAGNOSIS — E872 Acidosis, unspecified: Secondary | ICD-10-CM | POA: Diagnosis not present

## 2021-12-20 DIAGNOSIS — J4521 Mild intermittent asthma with (acute) exacerbation: Secondary | ICD-10-CM | POA: Insufficient documentation

## 2021-12-20 DIAGNOSIS — U071 COVID-19: Secondary | ICD-10-CM | POA: Insufficient documentation

## 2021-12-20 DIAGNOSIS — R0602 Shortness of breath: Secondary | ICD-10-CM | POA: Diagnosis present

## 2021-12-20 DIAGNOSIS — R7401 Elevation of levels of liver transaminase levels: Secondary | ICD-10-CM | POA: Insufficient documentation

## 2021-12-20 LAB — CBC WITH DIFFERENTIAL/PLATELET
Abs Immature Granulocytes: 0.2 10*3/uL — ABNORMAL HIGH (ref 0.00–0.07)
Basophils Absolute: 0 10*3/uL (ref 0.0–0.1)
Basophils Relative: 0 %
Eosinophils Absolute: 0 10*3/uL (ref 0.0–0.5)
Eosinophils Relative: 0 %
HCT: 43.3 % (ref 36.0–46.0)
Hemoglobin: 14.8 g/dL (ref 12.0–15.0)
Lymphocytes Relative: 10 %
Lymphs Abs: 1.9 10*3/uL (ref 0.7–4.0)
MCH: 29.4 pg (ref 26.0–34.0)
MCHC: 34.2 g/dL (ref 30.0–36.0)
MCV: 85.9 fL (ref 80.0–100.0)
Metamyelocytes Relative: 1 %
Monocytes Absolute: 1 10*3/uL (ref 0.1–1.0)
Monocytes Relative: 5 %
Neutro Abs: 16.1 10*3/uL — ABNORMAL HIGH (ref 1.7–7.7)
Neutrophils Relative %: 84 %
Platelets: 390 10*3/uL (ref 150–400)
RBC: 5.04 MIL/uL (ref 3.87–5.11)
RDW: 12.5 % (ref 11.5–15.5)
Smear Review: ADEQUATE
WBC: 19.2 10*3/uL — ABNORMAL HIGH (ref 4.0–10.5)
nRBC: 0 % (ref 0.0–0.2)
nRBC: 1 /100 WBC — ABNORMAL HIGH

## 2021-12-20 LAB — URINALYSIS, ROUTINE W REFLEX MICROSCOPIC
Bilirubin Urine: NEGATIVE
Glucose, UA: NEGATIVE mg/dL
Hgb urine dipstick: NEGATIVE
Ketones, ur: NEGATIVE mg/dL
Leukocytes,Ua: NEGATIVE
Nitrite: NEGATIVE
Protein, ur: NEGATIVE mg/dL
Specific Gravity, Urine: 1.035 — ABNORMAL HIGH (ref 1.005–1.030)
pH: 7.5 (ref 5.0–8.0)

## 2021-12-20 LAB — I-STAT VENOUS BLOOD GAS, ED
Acid-Base Excess: 3 mmol/L — ABNORMAL HIGH (ref 0.0–2.0)
Bicarbonate: 21.7 mmol/L (ref 20.0–28.0)
Calcium, Ion: 1.24 mmol/L (ref 1.15–1.40)
HCT: 44 % (ref 36.0–46.0)
Hemoglobin: 15 g/dL (ref 12.0–15.0)
O2 Saturation: 79 %
Potassium: 3.9 mmol/L (ref 3.5–5.1)
Sodium: 135 mmol/L (ref 135–145)
TCO2: 22 mmol/L (ref 22–32)
pCO2, Ven: 20.3 mmHg — ABNORMAL LOW (ref 44–60)
pH, Ven: 7.637 (ref 7.25–7.43)
pO2, Ven: 33 mmHg (ref 32–45)

## 2021-12-20 LAB — COMPREHENSIVE METABOLIC PANEL
ALT: 176 U/L — ABNORMAL HIGH (ref 0–44)
AST: 104 U/L — ABNORMAL HIGH (ref 15–41)
Albumin: 4 g/dL (ref 3.5–5.0)
Alkaline Phosphatase: 79 U/L (ref 38–126)
Anion gap: 15 (ref 5–15)
BUN: 21 mg/dL — ABNORMAL HIGH (ref 6–20)
CO2: 20 mmol/L — ABNORMAL LOW (ref 22–32)
Calcium: 9.6 mg/dL (ref 8.9–10.3)
Chloride: 101 mmol/L (ref 98–111)
Creatinine, Ser: 0.78 mg/dL (ref 0.44–1.00)
GFR, Estimated: >60 mL/min (ref >60)
Glucose, Bld: 109 mg/dL — ABNORMAL HIGH (ref 70–99)
Potassium: 4.1 mmol/L (ref 3.5–5.1)
Sodium: 136 mmol/L (ref 135–145)
Total Bilirubin: 0.4 mg/dL (ref 0.3–1.2)
Total Protein: 7 g/dL (ref 6.5–8.1)

## 2021-12-20 LAB — TROPONIN I (HIGH SENSITIVITY)
Troponin I (High Sensitivity): 3 ng/L (ref <18)
Troponin I (High Sensitivity): <2 ng/L (ref <18)

## 2021-12-20 LAB — SARS CORONAVIRUS 2 BY RT PCR: SARS Coronavirus 2 by RT PCR: POSITIVE — AB

## 2021-12-20 LAB — BRAIN NATRIURETIC PEPTIDE: B Natriuretic Peptide: 51.9 pg/mL (ref 0.0–100.0)

## 2021-12-20 LAB — LACTIC ACID, PLASMA: Lactic Acid, Venous: 5.2 mmol/L (ref 0.5–1.9)

## 2021-12-20 MED ORDER — LACTATED RINGERS IV BOLUS
1000.0000 mL | Freq: Once | INTRAVENOUS | Status: AC
  Administered 2021-12-20: 1000 mL via INTRAVENOUS

## 2021-12-20 MED ORDER — PIPERACILLIN-TAZOBACTAM 3.375 G IVPB
3.3750 g | Freq: Three times a day (TID) | INTRAVENOUS | Status: DC
  Administered 2021-12-20: 3.375 g via INTRAVENOUS
  Filled 2021-12-20 (×2): qty 50

## 2021-12-20 MED ORDER — IOHEXOL 350 MG/ML SOLN
100.0000 mL | Freq: Once | INTRAVENOUS | Status: AC | PRN
  Administered 2021-12-20: 75 mL via INTRAVENOUS

## 2021-12-20 MED ORDER — VANCOMYCIN HCL IN DEXTROSE 1-5 GM/200ML-% IV SOLN
1000.0000 mg | Freq: Once | INTRAVENOUS | Status: AC
  Administered 2021-12-20: 1000 mg via INTRAVENOUS
  Filled 2021-12-20: qty 200

## 2021-12-20 MED ORDER — IPRATROPIUM-ALBUTEROL 0.5-2.5 (3) MG/3ML IN SOLN
3.0000 mL | Freq: Once | RESPIRATORY_TRACT | Status: AC
  Administered 2021-12-20: 3 mL via RESPIRATORY_TRACT
  Filled 2021-12-20: qty 3

## 2021-12-20 MED ORDER — ACETAMINOPHEN 500 MG PO TABS
1000.0000 mg | ORAL_TABLET | Freq: Once | ORAL | Status: AC
  Administered 2021-12-20: 1000 mg via ORAL
  Filled 2021-12-20: qty 2

## 2021-12-20 MED ORDER — VANCOMYCIN HCL 750 MG/150ML IV SOLN
750.0000 mg | Freq: Two times a day (BID) | INTRAVENOUS | Status: DC
  Filled 2021-12-20: qty 150

## 2021-12-20 NOTE — ED Notes (Signed)
Recommended Purwick as pt has labored breathing while at rest. Pt refused states she couldn't use the purwick while laying down. Pt got up to use bedside commode. Noticed increased work of breathing RR 20-30s. No hypoxia remained on 2 L Nasal Cannula.

## 2021-12-20 NOTE — ED Triage Notes (Signed)
Pt arrives to ED with c/o shortness of breath post covid. Pt reports worsening SOB since ED d/c on 8/29. Was seen by PCP today who sent her back to ED for reevaluation and CTA.

## 2021-12-20 NOTE — Progress Notes (Signed)
Pharmacy Antibiotic Note  Stacy Ochoa is a 46 y.o. female admitted on 12/20/2021 with sepsis.  Pharmacy has been consulted for Zosyn (piperacillin-tazobactam) and Vancomycin dosing.  WBC 19.2, afebrile, LA 5.2 SCr 0.78  Plan: Zosyn 3.375g IV q8h (4 hour infusion). Initiate loading dose of Vancomycin '2000mg'$  IV x 1, followed by  Vancomycin '750mg'$  IV q12h (eAUC ~471)    > Goal AUC 400-550    > Check vancomycin levels at steady state  Monitor daily CBC, temp, SCr, and for clinical signs of improvement  F/u cultures and de-escalate antibiotics as able     Temp (24hrs), Avg:98.4 F (36.9 C), Min:98.2 F (36.8 C), Max:98.6 F (37 C)  Recent Labs  Lab 12/18/21 1449 12/20/21 1526 12/20/21 1805  WBC 15.1* 19.2*  --   CREATININE 0.90 0.78  --   LATICACIDVEN  --   --  5.2*    Estimated Creatinine Clearance: 97.4 mL/min (by C-G formula based on SCr of 0.78 mg/dL).    Allergies  Allergen Reactions   Aspirin Anaphylaxis   Nsaids Anaphylaxis   Shellfish Allergy Anaphylaxis    Antimicrobials this admission: Zosyn 8/31 >>  Vancomycin 8/31 >>   Dose adjustments this admission: N/A  Microbiology results: 8/31 BCx: pending  Thank you for allowing pharmacy to be a part of this patient's care.  Luisa Hart, PharmD, BCPS Clinical Pharmacist 12/20/2021 8:16 PM   Please refer to [facility] for pharmacy phone number

## 2021-12-20 NOTE — ED Notes (Signed)
Patient ambulated on RA. O2 remained 95-100% and HR 78-85. Patient complaining of dizziness during ambulation and returned to bed; further states numbness in her fingers. Patient coached on slowing her breathing. PA, Shirlee Limerick, made aware of VBG results, dizziness with ambulation, and finger numbness.

## 2021-12-20 NOTE — ED Notes (Signed)
ED TO INPATIENT HANDOFF REPORT  ED Nurse Name and Phone #: Dalphine Handing -[facility]   S Name/Age/Gender Stacy Ochoa 46 y.o. female Room/Bed: BH419/FX902  Code Status   Code Status: Prior  Home/SNF/Other Home Patient oriented to: self, place, time, and situation Is this baseline? Yes   Triage Complete: Triage complete  Chief Complaint Acute respiratory failure ([place]) [J96.00]  Triage Note Pt arrives to ED with c/o shortness of breath post covid. Pt reports worsening SOB since ED d/c on 8/29. Was seen by PCP today who sent her back to ED for reevaluation and CTA.    Allergies Allergies  Allergen Reactions   Aspirin Anaphylaxis   Nsaids Anaphylaxis   Shellfish Allergy Anaphylaxis    Level of Care/Admitting Diagnosis ED Disposition     ED Disposition  Admit   Condition  --   Comment  Hospital Area: [facility] [[id number]]  Level of Care: Telemetry Medical [104]  Interfacility transfer: Yes  May place patient in observation at [facility] or [place] if equivalent level of care is available:: Yes  Covid Evaluation: Symptomatic Person Under Investigation (PUI) or recent exposure (last 10 days) *Testing Required*  Diagnosis: Acute respiratory failure ([place]) [518.81.ICD-9-CM]  Admitting Physician: Shela Leff [[id number]]  Attending Physician: Gareth Morgan [[id number]]          B Medical/Surgery History Past Medical History:  Diagnosis Date   Asthma    History of anemia 2019   due to heavy vag bleeding    Migraine with aura    gets ~ 1-2 a month - controlled with medication    PONV (postoperative nausea and vomiting)    Past Surgical History:  Procedure Laterality Date   [place] N/A 12/08/2017   Procedure: CYSTOSCOPY;  Surgeon: Megan Salon, MD;  Location: [facility];  Service: Gynecology;  Laterality: N/A;   TOTAL LAPAROSCOPIC HYSTERECTOMY WITH SALPINGECTOMY Bilateral 12/08/2017    Procedure: TOTAL LAPAROSCOPIC HYSTERECTOMY WITH SALPINGECTOMY;  Surgeon: Megan Salon, MD;  Location: [facility];  Service: Gynecology;  Laterality: Bilateral;  specimen weight 233 g     A IV Location/Drains/Wounds Patient Lines/Drains/Airways Status     Active Line/Drains/Airways     Name Placement date Placement time Site Days   Peripheral IV 12/20/21 20 G 1" Anterior;Left;Proximal Forearm 12/20/21  1524  Forearm  less than 1   Peripheral IV 12/20/21 20 G Left;Posterior Hand 12/20/21  1858  Hand  less than 1            Intake/Output Last 24 hours No intake or output data in the 24 hours ending 12/20/21 2013  Labs/Imaging Results for orders placed or performed during the hospital encounter of 12/20/21 (from the past 48 hour(s))  CBC with Differential     Status: Abnormal   Collection Time: 12/20/21  3:26 PM  Result Value Ref Range   WBC 19.2 (H) 4.0 - 10.5 K/uL   RBC 5.04 3.87 - 5.11 MIL/uL   Hemoglobin 14.8 12.0 - 15.0 g/dL   HCT 43.3 36.0 - 46.0 %   MCV 85.9 80.0 - 100.0 fL   MCH 29.4 26.0 - 34.0 pg   MCHC 34.2 30.0 - 36.0 g/dL   RDW 12.5 11.5 - 15.5 %   Platelets 390 150 - 400 K/uL   nRBC 0.0 0.0 - 0.2 %   Neutrophils Relative % 84 %   Neutro Abs 16.1 (H) 1.7 - 7.7 K/uL   Lymphocytes  Relative 10 %   Lymphs Abs 1.9 0.7 - 4.0 K/uL   Monocytes Relative 5 %   Monocytes Absolute 1.0 0.1 - 1.0 K/uL   Eosinophils Relative 0 %   Eosinophils Absolute 0.0 0.0 - 0.5 K/uL   Basophils Relative 0 %   Basophils Absolute 0.0 0.0 - 0.1 K/uL   WBC Morphology MILD LEFT SHIFT (1-5% METAS, OCC MYELO, OCC BANDS)     Comment: WHITE BLOOD CELL COUNT CONFIRMED ON SMEAR   Smear Review PLATELETS APPEAR ADEQUATE    nRBC 1 (H) 0 /100 WBC   Metamyelocytes Relative 1 %   Abs Immature Granulocytes 0.20 (H) 0.00 - 0.07 K/uL   Bite Cells PRESENT    Burr Cells PRESENT    Giant PLTs PRESENT     Comment: Performed at [facility], [address]  [place], [place] [address]  Comprehensive metabolic panel     Status: Abnormal   Collection Time: 12/20/21  3:26 PM  Result Value Ref Range   Sodium 136 135 - 145 mmol/L   Potassium 4.1 3.5 - 5.1 mmol/L   Chloride 101 98 - 111 mmol/L   CO2 20 (L) 22 - 32 mmol/L   Glucose, Bld 109 (H) 70 - 99 mg/dL    Comment: Glucose reference range applies only to samples taken after fasting for at least 8 hours.   BUN 21 (H) 6 - 20 mg/dL   Creatinine, Ser 0.78 0.44 - 1.00 mg/dL   Calcium 9.6 8.9 - 10.3 mg/dL   Total Protein 7.0 6.5 - 8.1 g/dL   Albumin 4.0 3.5 - 5.0 g/dL   AST 104 (H) 15 - 41 U/L   ALT 176 (H) 0 - 44 U/L   Alkaline Phosphatase 79 38 - 126 U/L   Total Bilirubin 0.4 0.3 - 1.2 mg/dL   GFR, Estimated >60 >60 mL/min    Comment: (NOTE) Calculated using the CKD-EPI Creatinine Equation (2021)    Anion gap 15 5 - 15    Comment: Performed at [facility], [address], [place], [place] [address]  Brain natriuretic peptide     Status: None   Collection Time: 12/20/21  3:26 PM  Result Value Ref Range   B Natriuretic Peptide 51.9 0.0 - 100.0 pg/mL    Comment: Performed at [facility], [place], [place] [address]  Troponin I (High Sensitivity)     Status: None   Collection Time: 12/20/21  3:26 PM  Result Value Ref Range   Troponin I (High Sensitivity) 3 <18 ng/L    Comment: (NOTE) Elevated high sensitivity troponin I (hsTnI) values and significant  changes across serial measurements may suggest ACS but many other  chronic and acute conditions are known to elevate hsTnI results.  Refer to the "Links" section for chest pain algorithms and additional  guidance. Performed at [facility], [place], [place] [address]   Troponin I (High Sensitivity)     Status: None   Collection Time: 12/20/21  5:35 PM  Result Value Ref Range   Troponin I (High Sensitivity) <2 <18 ng/L    Comment: (NOTE) Elevated high  sensitivity troponin I (hsTnI) values and significant  changes across serial measurements may suggest ACS but many other  chronic and acute conditions are known to elevate hsTnI results.  Refer to the "Links" section for chest pain algorithms and additional  guidance. Performed at [facility], [address] [place], [place] [address]  Lactic acid, plasma     Status: Abnormal   Collection Time: 12/20/21  6:05 PM  Result Value Ref Range   Lactic Acid, Venous 5.2 (HH) 0.5 - 1.9 mmol/L    Comment: CRITICAL RESULT CALLED TO, READ BACK BY AND VERIFIED WITH: Veneda Melter [id number] 12/20/2021 DBRADLEY Performed at [facility], [address] [place], [place] [address]   I-Stat venous blood gas, ED     Status: Abnormal   Collection Time: 12/20/21  6:10 PM  Result Value Ref Range   pH, Ven 7.637 (HH) 7.25 - 7.43   pCO2, Ven 20.3 (L) 44 - 60 mmHg   pO2, Ven 33 32 - 45 mmHg   Bicarbonate 21.7 20.0 - 28.0 mmol/L   TCO2 22 22 - 32 mmol/L   O2 Saturation 79 %   Acid-Base Excess 3.0 (H) 0.0 - 2.0 mmol/L   Sodium 135 135 - 145 mmol/L   Potassium 3.9 3.5 - 5.1 mmol/L   Calcium, Ion 1.24 1.15 - 1.40 mmol/L   HCT 44.0 36.0 - 46.0 %   Hemoglobin 15.0 12.0 - 15.0 g/dL   Sample type VENOUS    Comment NOTIFIED PHYSICIAN   Urinalysis, Routine w reflex microscopic Urine, Clean Catch     Status: Abnormal   Collection Time: 12/20/21  7:34 PM  Result Value Ref Range   Color, Urine COLORLESS (A) YELLOW   APPearance CLEAR CLEAR   Specific Gravity, Urine 1.035 (H) 1.005 - 1.030   pH 7.5 5.0 - 8.0   Glucose, UA NEGATIVE NEGATIVE mg/dL   Hgb urine dipstick NEGATIVE NEGATIVE   Bilirubin Urine NEGATIVE NEGATIVE   Ketones, ur NEGATIVE NEGATIVE mg/dL   Protein, ur NEGATIVE NEGATIVE mg/dL   Nitrite NEGATIVE NEGATIVE   Leukocytes,Ua NEGATIVE NEGATIVE    Comment: Performed at [facility], [place], [place] [address]   CT Angio Chest  PE W and/or Wo Contrast  Result Date: 12/20/2021 CLINICAL DATA:  Shortness of breath, high clinical suspicion for pulmonary embolism EXAM: CT ANGIOGRAPHY CHEST WITH CONTRAST TECHNIQUE: Multidetector CT imaging of the chest was performed using the standard protocol during bolus administration of intravenous contrast. Multiplanar CT image reconstructions and MIPs were obtained to evaluate the vascular anatomy. RADIATION DOSE REDUCTION: This exam was performed according to the departmental dose-optimization program which includes automated exposure control, adjustment of the mA and/or kV according to patient size and/or use of iterative reconstruction technique. CONTRAST:  73m OMNIPAQUE IOHEXOL 350 MG/ML SOLN COMPARISON:  Previous CT done on 12/12/2017 and chest radiograph done on 12/18/2021 FINDINGS: Cardiovascular: There is homogeneous enhancement in thoracic aorta. Right subclavian arteries arising from the left side of arch and is traversing the posterior mediastinum behind the esophagus. There are no intraluminal filling defects in central pulmonary artery branches. Evaluation of small peripheral branches in the lower lung fields is limited by motion artifacts. Mediastinum/Nodes: No significant lymphadenopathy is seen. Lungs/Pleura: There is no focal pulmonary consolidation. Breathing motion limits evaluation of lower lung fields. There is no pleural effusion or pneumothorax. Upper Abdomen: No acute findings are seen. Musculoskeletal: Unremarkable. Review of the MIP images confirms the above findings. IMPRESSION: There is no evidence of central pulmonary artery embolism. There is no evidence of thoracic aortic dissection. There is no focal pulmonary consolidation. Electronically Signed   By: PElmer PickerM.D.   On: 12/20/2021 16:10    Pending Labs [employer](From admission, onward)     Start     Ordered  12/20/21 1926  SARS Coronavirus 2 by RT PCR (hospital order, performed in [facility]  hospital lab) *cepheid single result test* Anterior Nasal Swab  Once,   URGENT       Question:  Patient immune status  Answer:  Normal   12/20/21 1925   12/20/21 1846  Blood culture (routine x 2)  BLOOD CULTURE X 2,   R (with STAT occurrences)      12/20/21 1846   12/20/21 1759  Lactic acid, plasma  Now then every 2 hours,   R (with STAT occurrences)      12/20/21 1759            Vitals/Pain Today's Vitals   12/20/21 1830 12/20/21 1900 12/20/21 1928 12/20/21 2000  BP: 125/86  126/86 133/88  Pulse: 66 61 62 64  Resp: '19 17 18 17  '$ Temp:   98.2 F (36.8 C)   TempSrc:   Oral   SpO2: 99% 100% 97% 99%  PainSc:        Isolation Precautions No active isolations  Medications Medications  piperacillin-tazobactam (ZOSYN) IVPB 3.375 g (3.375 g Intravenous New Bag/Given 12/20/21 1952)  vancomycin (VANCOCIN) IVPB 1000 mg/200 mL premix (1,000 mg Intravenous New Bag/Given 12/20/21 1942)    Followed by  vancomycin (VANCOCIN) IVPB 1000 mg/200 mL premix (has no administration in time range)  vancomycin (VANCOREADY) IVPB 750 mg/150 mL (has no administration in time range)  ipratropium-albuterol (DUONEB) 0.5-2.5 (3) MG/3ML nebulizer solution 3 mL (3 mLs Nebulization Given 12/20/21 1536)  iohexol (OMNIPAQUE) 350 MG/ML injection 100 mL (75 mLs Intravenous Contrast Given 12/20/21 1552)  lactated ringers bolus 1,000 mL (1,000 mLs Intravenous New Bag/Given 12/20/21 1859)  lactated ringers bolus 1,000 mL (1,000 mLs Intravenous New Bag/Given 12/20/21 1933)    Mobility walks with person assist Low fall risk   Focused Assessments Pulmonary Assessment Handoff:  Lung sounds: Bilateral Breath Sounds: Diminished O2 Device: Nasal Cannula O2 Flow Rate (L/min): 2 L/min    R Recommendations: See Admitting Provider Note  Report given to:   Additional Notes: -

## 2021-12-20 NOTE — Plan of Care (Signed)
[facility] will assume care on arrival to accepting facility. Until arrival, care as per EDP. However, [facility] available 24/7 for questions and assistance.  Nursing staff, please page [facility]H Admits and Consults ([phone]) as soon as the patient arrives to the hospital.   

## 2021-12-20 NOTE — ED Notes (Signed)
Blood cultures drawn before start of antibotics 

## 2021-12-20 NOTE — ED Provider Notes (Signed)
[place] EMERGENCY DEPT Provider Note   CSN: [id number] Arrival date & time: 12/20/21  1511     History Past medical history of asthma  Chief Complaint  Patient presents with   Shortness of Breath    Stacy Ochoa is a 46 y.o. female.  Patient presents the ED with shortness of breath.  She reports being diagnosed with COVID on August 22.  She has since had improvement in her cough and fevers.  She completed a course of Paxlovid and prednisone.  Proximately 4 days ago she started developing worsening shortness of breath.  She was sent to the emergency department 2 days ago for persistent symptoms.  She was found to have a leukocytosis to 15.1, negative troponins x2, and a negative chest x-ray.  She was ultimately discharged with a diagnosis of asthma exacerbation.  After improvement in the emergency department with nebulizers and steroids, she was discharged with continuation of her steroid but first as well as nebulizers every 4 hours.  Seen again by her PCP for follow-up today with continued shortness of breath despite every 4 hour nebulizer treatments and finishing her prednisone burst.  Her PCP sent her here to evaluate for PE, and myocarditis related to her COVID-19.  Today, patient is endorsing shortness of breath, chest tightness in the center of her chest.  She denies any leg swelling, nausea, vomiting, dizziness, numbness, or weakness.  She states that she has a remote history of asthma, but has not had problems with it in multiple years.  She does not smoke and has no history of any other chronic lung disease.  No history of blood clots, no recent traveling, fevers, chills, etc.   Shortness of Breath Associated symptoms: chest pain   Associated symptoms: no abdominal pain, no cough, no fever and no vomiting        Home Medications Prior to Admission medications   Medication Sig Start Date End Date Taking? Authorizing Provider  albuterol (PROVENTIL) (2.5 MG/3ML)  0.083% nebulizer solution Take 2.5 mg by nebulization every 4 (four) hours as needed for wheezing or shortness of breath.  04/25/16   [provider]  albuterol (VENTOLIN HFA) 108 (90 Base) MCG/ACT inhaler Inhale into the lungs daily as needed. 09/19/16 04/12/21  [provider]  albuterol (VENTOLIN HFA) 108 (90 Base) MCG/ACT inhaler Inhale 1-2 puffs into the lungs every 6 (six) hours as needed for wheezing or shortness of breath. 12/18/21   Tretha Sciara, MD  Ascorbic Acid (VITAMIN C PO) Take by mouth.    [provider]  Cyanocobalamin (B-12 PO) Take by mouth.    [provider]  EPINEPHrine (EPIPEN 2-PAK) 0.3 mg/0.3 mL IJ SOAJ injection Inject 0.3 mg into the muscle once as needed (anaphylaxis).  Patient not taking: Reported on 10/26/2019 02/05/15   [provider]  fluticasone (FLONASE) 50 MCG/ACT nasal spray Place 2 sprays into both nostrils 2 (two) times daily.  09/02/17   [provider]  ipratropium-albuterol (DUONEB) 0.5-2.5 (3) MG/3ML SOLN Take 3 mLs by nebulization every 4 (four) hours as needed for up to 20 days. 12/18/21 01/07/22  Tretha Sciara, MD  NURTEC 75 MG TBDP Take by mouth. 08/03/19   [provider]  predniSONE (DELTASONE) 10 MG tablet Take 4 tablets (40 mg total) by mouth daily for 4 days, THEN 2 tablets (20 mg total) daily for 4 days, THEN 1 tablet (10 mg total) daily for 4 days. 12/18/21 12/30/21  Tretha Sciara, MD  QULIPTA 60 MG TABS  Take 1 tablet by mouth daily. 03/29/21   [provider]  VITAMIN D PO Take 5,000 Int'l Units by mouth.    [provider]      Allergies    Aspirin, Nsaids, and Shellfish allergy    Review of Systems   Review of Systems  Constitutional:  Negative for fever.  Respiratory:  Positive for chest tightness and shortness of breath. Negative for cough.   Cardiovascular:  Positive for chest pain. Negative for leg swelling.  Gastrointestinal:  Negative for abdominal  pain, nausea and vomiting.  Neurological:  Negative for dizziness.  All other systems reviewed and are negative.   Physical Exam Updated Vital Signs BP 133/88   Pulse 64   Temp 98.2 F (36.8 C) (Oral)   Resp 17   LMP 11/06/2017 (Exact Date)   SpO2 99%  Physical Exam Vitals and nursing note reviewed.  Constitutional:      General: She is not in acute distress.    Appearance: Normal appearance. She is well-developed. She is not ill-appearing, toxic-appearing or diaphoretic.  HENT:     Head: Normocephalic and atraumatic.     Nose: No nasal deformity.     Mouth/Throat:     Lips: Pink. No lesions.     Mouth: Mucous membranes are moist.     Pharynx: Oropharynx is clear. No pharyngeal swelling or oropharyngeal exudate.  Eyes:     General: Gaze aligned appropriately. No scleral icterus.       Right eye: No discharge.        Left eye: No discharge.     Conjunctiva/sclera: Conjunctivae normal.     Right eye: Right conjunctiva is not injected. No exudate or hemorrhage.    Left eye: Left conjunctiva is not injected. No exudate or hemorrhage. Neck:     Vascular: No JVD.     Trachea: No tracheal deviation.  Cardiovascular:     Rate and Rhythm: Normal rate and regular rhythm.     Pulses: Normal pulses.     Heart sounds: Normal heart sounds. No murmur heard.    No friction rub. No gallop.  Pulmonary:     Effort: Pulmonary effort is normal. Tachypnea present. No accessory muscle usage or respiratory distress.     Breath sounds: No stridor. Examination of the right-upper field reveals decreased breath sounds. Examination of the left-upper field reveals decreased breath sounds. Examination of the right-middle field reveals decreased breath sounds. Examination of the left-middle field reveals decreased breath sounds. Decreased breath sounds present. No wheezing, rhonchi or rales.  Chest:     Chest wall: No tenderness.  Abdominal:     Palpations: Abdomen is soft.     Tenderness: There is  no abdominal tenderness. There is no guarding or rebound.  Musculoskeletal:     Right lower leg: No tenderness. No edema.     Left lower leg: No tenderness. No edema.  Skin:    General: Skin is warm and dry.  Neurological:     General: No focal deficit present.     Mental Status: She is alert and oriented to person, place, and time.  Psychiatric:        Mood and Affect: Mood normal.        Speech: Speech normal.        Behavior: Behavior normal. Behavior is cooperative.     ED Results / Procedures / Treatments   Labs (all labs ordered are listed, but only abnormal results are displayed) Labs Reviewed  SARS CORONAVIRUS 2 BY RT PCR - Abnormal; Notable for the following components:      Result Value   SARS Coronavirus 2 by RT PCR POSITIVE (*)    All other components within normal limits  CBC WITH DIFFERENTIAL/PLATELET - Abnormal; Notable for the following components:   WBC 19.2 (*)    Neutro Abs 16.1 (*)    nRBC 1 (*)    Abs Immature Granulocytes 0.20 (*)    All other components within normal limits  COMPREHENSIVE METABOLIC PANEL - Abnormal; Notable for the following components:   CO2 20 (*)    Glucose, Bld 109 (*)    BUN 21 (*)    AST 104 (*)    ALT 176 (*)    All other components within normal limits  LACTIC ACID, PLASMA - Abnormal; Notable for the following components:   Lactic Acid, Venous 5.2 (*)    All other components within normal limits  URINALYSIS, ROUTINE W REFLEX MICROSCOPIC - Abnormal; Notable for the following components:   Color, Urine COLORLESS (*)    Specific Gravity, Urine 1.035 (*)    All other components within normal limits  I-STAT VENOUS BLOOD GAS, ED - Abnormal; Notable for the following components:   pH, Ven 7.637 (*)    pCO2, Ven 20.3 (*)    Acid-Base Excess 3.0 (*)    All other components within normal limits  CULTURE, BLOOD (ROUTINE X 2)  CULTURE, BLOOD (ROUTINE X 2)  BRAIN NATRIURETIC PEPTIDE  LACTIC ACID, PLASMA  TROPONIN I (HIGH  SENSITIVITY)  TROPONIN I (HIGH SENSITIVITY)    EKG EKG Interpretation  Date/Time:  Thursday December 20 2021 15:23:40 EDT Ventricular Rate:  70 PR Interval:  130 QRS Duration: 90 QT Interval:  433 QTC Calculation: 468 R Axis:   74 Text Interpretation: Sinus rhythm Baseline wander in lead(s) II III aVF V5 No significant change since last tracing Confirmed by Gareth Morgan [phone]) on 12/20/2021 4:40:29 PM  Radiology CT Angio Chest PE W and/or Wo Contrast  Result Date: 12/20/2021 CLINICAL DATA:  Shortness of breath, high clinical suspicion for pulmonary embolism EXAM: CT ANGIOGRAPHY CHEST WITH CONTRAST TECHNIQUE: Multidetector CT imaging of the chest was performed using the standard protocol during bolus administration of intravenous contrast. Multiplanar CT image reconstructions and MIPs were obtained to evaluate the vascular anatomy. RADIATION DOSE REDUCTION: This exam was performed according to the departmental dose-optimization program which includes automated exposure control, adjustment of the mA and/or kV according to patient size and/or use of iterative reconstruction technique. CONTRAST:  30m OMNIPAQUE IOHEXOL 350 MG/ML SOLN COMPARISON:  Previous CT done on 12/12/2017 and chest radiograph done on 12/18/2021 FINDINGS: Cardiovascular: There is homogeneous enhancement in thoracic aorta. Right subclavian arteries arising from the left side of arch and is traversing the posterior mediastinum behind the esophagus. There are no intraluminal filling defects in central pulmonary artery branches. Evaluation of small peripheral branches in the lower lung fields is limited by motion artifacts. Mediastinum/Nodes: No significant lymphadenopathy is seen. Lungs/Pleura: There is no focal pulmonary consolidation. Breathing motion limits evaluation of lower lung fields. There is no pleural effusion or pneumothorax. Upper Abdomen: No acute findings are seen. Musculoskeletal: Unremarkable. Review of the MIP  images confirms the above findings. IMPRESSION: There is no evidence of central pulmonary artery embolism. There is no evidence of thoracic aortic dissection. There is no focal pulmonary consolidation. Electronically Signed   By: PElmer PickerM.D.   On: 12/20/2021 16:10    Procedures .Critical Care  Performed by: Adolphus Birchwood, PA-C Authorized by: Adolphus Birchwood, PA-C   Critical care provider statement:    Critical care time (minutes):  45   Critical care time was exclusive of:  Separately billable procedures and treating other patients   Critical care was necessary to treat or prevent imminent or life-threatening deterioration of the following conditions:  Sepsis and respiratory failure   Critical care was time spent personally by me on the following activities:  Blood draw for specimens, development of treatment plan with patient or surrogate, discussions with consultants, discussions with primary provider, evaluation of patient's response to treatment, examination of patient, obtaining history from patient or surrogate, review of old charts, re-evaluation of patient's condition, pulse oximetry, ordering and review of radiographic studies, ordering and review of laboratory studies and ordering and performing treatments and interventions   Care discussed with: admitting provider     This patient was on telemetry or cardiac monitoring during their time in the ED.    Medications Ordered in ED Medications  piperacillin-tazobactam (ZOSYN) IVPB 3.375 g (3.375 g Intravenous New Bag/Given 12/20/21 1952)  vancomycin (VANCOCIN) IVPB 1000 mg/200 mL premix (1,000 mg Intravenous New Bag/Given 12/20/21 1942)    Followed by  vancomycin (VANCOCIN) IVPB 1000 mg/200 mL premix (has no administration in time range)  vancomycin (VANCOREADY) IVPB 750 mg/150 mL (has no administration in time range)  ipratropium-albuterol (DUONEB) 0.5-2.5 (3) MG/3ML nebulizer solution 3 mL (3 mLs Nebulization Given  12/20/21 1536)  iohexol (OMNIPAQUE) 350 MG/ML injection 100 mL (75 mLs Intravenous Contrast Given 12/20/21 1552)  lactated ringers bolus 1,000 mL (0 mLs Intravenous Stopped 12/20/21 2022)  lactated ringers bolus 1,000 mL (1,000 mLs Intravenous New Bag/Given 12/20/21 1933)    ED Course/ Medical Decision Making/ A&P Clinical Course as of 12/20/21 2033  Thu Dec 20, 2021  1609 New transaminitis noted on labs AST/ALT 104/176 [GL]  1609 WBC(!): 19.2 WBC 19.2, could be 2/2 to steroid therapy [GL]  1644 Reassessed with improvement in tachypnea. [GL]  [id number] Patient here with continued respiratory distress for 4 days in the setting of recent COVID. She is not hypoxic and has stable vitals. Labs reveal leukocytosis. Respiratory alkylosis noted suggesting hyperventilation. She also has a significant lactic acidosis in addition to her new LFT elevations. No clear infectious source has been identified. Will order blood cultures, put on broad spectrum abx, and admit to medicine.  [GL]  1923 I discussed case with Dr. Marlowe Sax who will accept patient for admission [GL]    Clinical Course User Index [GL] Sua Spadafora, Adora Fridge, PA-C                           Medical Decision Making Amount and/or Complexity of Data Reviewed Labs: ordered. Decision-making details documented in ED Course. Radiology: ordered.  Risk Prescription drug management. Decision regarding hospitalization.    MDM  This is a 46 y.o. female who presents to the ED with shortness of breath and chest tightness The differential of this patient includes but is not limited to CHF, ACS, COPD, Asthma, PNA, Anaphylaxis, PE, PTX, Anxiety, Viral PNA, and Bronchitis, Myocarditis, Pericarditis  Initial Impression  Patient is significantly dyspneic on exam.  She does have stable vitals and is afebrile.  Respiratory rate is 27.  not tachycardic.  Blood pressure normal. I have reviewed recent significant workup including a notable leukocytosis. She has not  benefited from outpatient steroid treatment and nebulizers. I have ordered CTA chest  to evaluate for PE versus occult PNA.   I personally ordered, reviewed, and interpreted all laboratory work and imaging and agree with radiologist interpretation. Results interpreted below:  WBC 19.2, CO2 20, kidney function stable, mild new transaminitis with AST and ALT elevation, VBG reveals significant respiratory alkalosis, initial troponin is less than 2, BNP is 51, lactic acid is 5.2, urinalysis without any leukocytes CTA chest shows no sign of pneumonia, PE, or aortic dissection.  EKG normal sinus rhythm and nonischemic.  Assessment/Plan:  Patient continues to be tachypneic despite being given a DuoNeb.  Her labs reveal a significant leukocytosis which could be secondary to steroid use, but infection is not slated.  She does have a new transaminitis with unclear etiology as she has no abdominal pain or symptoms to suggest a gallbladder or intra-abdominal infection.  She also was found to have a significant respiratory alkalosis which could be due to her hyperventilation.  She does have a lactic acidosis of 5.2 of unclear etiology.  This could be infectious due to sepsis, due to albuterol nebulizer treatments, or secondary to other pathology. With negative troponins and normal EKG, it is unlikely to be myocarditis or pericarditis Do not feel that this is an asthma exacerbation as patient has no wheezing present. Given that symptoms have not improved and she has a notable leukocytosis with lactic acidosis, sepsis cannot be excluded.  We have started her on IV fluids, broad-spectrum antibiotics, and order blood cultures.  If this is infectious, there is an unclear source as she has had an unremarkable CT of her chest, urine does not seem infected, no reason to presume intra-abdominal pathology, no other sources can be identified.  Patient will need admission for further evaluation and work-up.  I discussed this  case with [facility] hospitalist service and spoke with Dr. Marlowe Sax who will accept patient for admission   Charting Requirements Additional history is obtained from:  Independent historian External Records from outside source obtained and reviewed including: Recent laboratory work, recent PCP note Social Determinants of Health:  none Pertinant PMH that complicates patient's illness: Recent COVID diagnosis  Patient Care Problems that were addressed during this visit: - Respiratory Distress: Acute illness with systemic symptoms - Lactic Acidosis: Acute illness This patient was maintained on a cardiac monitor/telemetry. I personally viewed and interpreted the cardiac monitor which reveals an underlying rhythm of NSR Medications given in ED: IVF 2L, Vancomycin, Zosyn, duoneb Reevaluation of the patient after these medicines showed that the patient stayed the same I have reviewed home medications and made changes accordingly.  Critical Care Interventions: acute respiratory failure Consultations: THS Disposition: Admit  This is a shared visit with my attending physician, Dr. Billy Fischer.  We have discussed this patient and they have independently evaluated this patient. The plan was altered or changed as needed.  Portions of this note were generated with Lobby[employer]. Dictation errors may occur despite best attempts at proofreading.    Final Clinical Impression(s) / ED Diagnoses Final diagnoses:  Respiratory distress  Lactic acidosis    Rx / DC Orders ED Discharge Orders     None         Adolphus Birchwood, PA-C 12/20/21 2033    Gareth Morgan, MD 12/22/21 [phone]

## 2021-12-21 ENCOUNTER — Observation Stay (HOSPITAL_COMMUNITY): Payer: 59

## 2021-12-21 DIAGNOSIS — R0603 Acute respiratory distress: Secondary | ICD-10-CM

## 2021-12-21 DIAGNOSIS — R7401 Elevation of levels of liver transaminase levels: Secondary | ICD-10-CM | POA: Diagnosis not present

## 2021-12-21 DIAGNOSIS — R0602 Shortness of breath: Secondary | ICD-10-CM | POA: Diagnosis present

## 2021-12-21 DIAGNOSIS — U071 COVID-19: Secondary | ICD-10-CM

## 2021-12-21 DIAGNOSIS — J9600 Acute respiratory failure, unspecified whether with hypoxia or hypercapnia: Secondary | ICD-10-CM | POA: Diagnosis not present

## 2021-12-21 DIAGNOSIS — J4521 Mild intermittent asthma with (acute) exacerbation: Secondary | ICD-10-CM | POA: Diagnosis not present

## 2021-12-21 DIAGNOSIS — Z79899 Other long term (current) drug therapy: Secondary | ICD-10-CM | POA: Diagnosis not present

## 2021-12-21 DIAGNOSIS — R531 Weakness: Secondary | ICD-10-CM | POA: Diagnosis not present

## 2021-12-21 DIAGNOSIS — E872 Acidosis, unspecified: Secondary | ICD-10-CM | POA: Diagnosis not present

## 2021-12-21 LAB — PROTIME-INR
INR: 1 (ref 0.8–1.2)
Prothrombin Time: 13.5 seconds (ref 11.4–15.2)

## 2021-12-21 LAB — CBC
HCT: 42.9 % (ref 36.0–46.0)
Hemoglobin: 14.5 g/dL (ref 12.0–15.0)
MCH: 29.8 pg (ref 26.0–34.0)
MCHC: 33.8 g/dL (ref 30.0–36.0)
MCV: 88.3 fL (ref 80.0–100.0)
Platelets: 346 10*3/uL (ref 150–400)
RBC: 4.86 MIL/uL (ref 3.87–5.11)
RDW: 12.7 % (ref 11.5–15.5)
WBC: 14.1 10*3/uL — ABNORMAL HIGH (ref 4.0–10.5)
nRBC: 0 % (ref 0.0–0.2)

## 2021-12-21 LAB — BRAIN NATRIURETIC PEPTIDE: B Natriuretic Peptide: 49.5 pg/mL (ref 0.0–100.0)

## 2021-12-21 LAB — COMPREHENSIVE METABOLIC PANEL
ALT: 159 U/L — ABNORMAL HIGH (ref 0–44)
AST: 49 U/L — ABNORMAL HIGH (ref 15–41)
Albumin: 3.1 g/dL — ABNORMAL LOW (ref 3.5–5.0)
Alkaline Phosphatase: 74 U/L (ref 38–126)
Anion gap: 11 (ref 5–15)
BUN: 14 mg/dL (ref 6–20)
CO2: 24 mmol/L (ref 22–32)
Calcium: 9.4 mg/dL (ref 8.9–10.3)
Chloride: 101 mmol/L (ref 98–111)
Creatinine, Ser: 0.83 mg/dL (ref 0.44–1.00)
GFR, Estimated: >60 mL/min (ref >60)
Glucose, Bld: 112 mg/dL — ABNORMAL HIGH (ref 70–99)
Potassium: 4.3 mmol/L (ref 3.5–5.1)
Sodium: 136 mmol/L (ref 135–145)
Total Bilirubin: 0.5 mg/dL (ref 0.3–1.2)
Total Protein: 6.1 g/dL — ABNORMAL LOW (ref 6.5–8.1)

## 2021-12-21 LAB — C-REACTIVE PROTEIN: CRP: 0.5 mg/dL (ref <1.0)

## 2021-12-21 LAB — PROCALCITONIN: Procalcitonin: <0.1 ng/mL

## 2021-12-21 LAB — TSH: TSH: 0.284 u[IU]/mL — ABNORMAL LOW (ref 0.350–4.500)

## 2021-12-21 LAB — LACTATE DEHYDROGENASE: LDH: 221 U/L — ABNORMAL HIGH (ref 98–192)

## 2021-12-21 LAB — VITAMIN B12: Vitamin B-12: 811 pg/mL (ref 180–914)

## 2021-12-21 LAB — MAGNESIUM: Magnesium: 2.2 mg/dL (ref 1.7–2.4)

## 2021-12-21 LAB — HIV ANTIBODY (ROUTINE TESTING W REFLEX): HIV Screen 4th Generation wRfx: NONREACTIVE

## 2021-12-21 MED ORDER — GUAIFENESIN-DM 100-10 MG/5ML PO SYRP: 10.0000 mL | ORAL_SOLUTION | ORAL | Status: DC | PRN

## 2021-12-21 MED ORDER — DEXAMETHASONE 6 MG PO TABS: 6.0000 mg | ORAL_TABLET | Freq: Every day | ORAL | Status: DC

## 2021-12-21 MED ORDER — MELATONIN 5 MG PO TABS: 10.0000 mg | ORAL_TABLET | Freq: Every evening | ORAL | Status: DC | PRN

## 2021-12-21 MED ORDER — LACTATED RINGERS IV SOLN: INTRAVENOUS | Status: AC

## 2021-12-21 MED ORDER — DEXAMETHASONE SODIUM PHOSPHATE 10 MG/ML IJ SOLN
10.0000 mg | Freq: Once | INTRAMUSCULAR | Status: AC
Start: 2021-12-21 — End: 2021-12-21
  Administered 2021-12-21: 10 mg via INTRAVENOUS
  Filled 2021-12-21: qty 1

## 2021-12-21 MED ORDER — ONDANSETRON HCL 4 MG PO TABS: 4.0000 mg | ORAL_TABLET | Freq: Four times a day (QID) | ORAL | Status: DC | PRN

## 2021-12-21 MED ORDER — HYDROCODONE-ACETAMINOPHEN 5-325 MG PO TABS: 1.0000 | ORAL_TABLET | ORAL | Status: DC | PRN

## 2021-12-21 MED ORDER — SUMATRIPTAN SUCCINATE 50 MG PO TABS: 50.0000 mg | ORAL_TABLET | ORAL | Status: DC | PRN

## 2021-12-21 MED ORDER — ONDANSETRON HCL 4 MG/2ML IJ SOLN: 4.0000 mg | Freq: Four times a day (QID) | INTRAMUSCULAR | Status: DC | PRN

## 2021-12-21 MED ORDER — IPRATROPIUM-ALBUTEROL 0.5-2.5 (3) MG/3ML IN SOLN: 3.0000 mL | Freq: Four times a day (QID) | RESPIRATORY_TRACT | Status: DC | PRN

## 2021-12-21 MED ORDER — HEPARIN SODIUM (PORCINE) 5000 UNIT/ML IJ SOLN
5000.0000 [IU] | Freq: Three times a day (TID) | INTRAMUSCULAR | Status: DC
  Administered 2021-12-21 – 2021-12-22 (×4): 5000 [IU] via SUBCUTANEOUS
  Filled 2021-12-21 (×3): qty 1

## 2021-12-21 MED ORDER — ACETAMINOPHEN 325 MG PO TABS
650.0000 mg | ORAL_TABLET | Freq: Four times a day (QID) | ORAL | Status: DC | PRN
  Administered 2021-12-21: 650 mg via ORAL
  Filled 2021-12-21: qty 2

## 2021-12-21 MED ORDER — KETOROLAC TROMETHAMINE 30 MG/ML IJ SOLN
30.0000 mg | Freq: Once | INTRAMUSCULAR | Status: DC
  Filled 2021-12-21: qty 1

## 2021-12-21 MED ORDER — TRAZODONE HCL 50 MG PO TABS: 50.0000 mg | ORAL_TABLET | Freq: Every evening | ORAL | Status: DC | PRN

## 2021-12-21 NOTE — Evaluation (Signed)
Physical Therapy Evaluation Patient Details Name: Stacy Ochoa MRN: [id number] DOB: 1975/10/03 Today's Date: 12/21/2021  History of Present Illness  46 y.o. female presents to [facility] hospital on 12/10/2021 due to SOB, testing positive for COVID on 8/24. Pt admitted for acute respiratory failure and asthma exacerbation. PMH includes migraine, PONV.  Clinical Impression  Pt presents to PT with deficits in activity tolerance, cardiopulmonary function, gait, balance, power. Pt reports feeling limited by dyspnea, becoming tachypneic into the low 40s with limited activity. Pt also reports dizziness with standing and ambulation. Pt is able to ambulate short distances within the room at this time. Pt reports having sufficient support from her spouse if discharged today. PT anticipates the pt will progress well with continued mobility at home. PT provides education on the need for hydration. PT recommends discharge home when medically ready, no current post-acute PT needs.     Recommendations for follow up therapy are one component of a multi-disciplinary discharge planning process, led by the attending physician.  Recommendations may be updated based on patient status, additional functional criteria and insurance authorization.  Follow Up Recommendations No PT follow up      Assistance Recommended at Discharge Intermittent Supervision/Assistance  Patient can return home with the following  A little help with walking and/or transfers;A little help with bathing/dressing/bathroom;Assistance with cooking/housework;Assist for transportation;Help with stairs or ramp for entrance    Equipment Recommendations None recommended by PT (pt declines need for [facility])  Recommendations for Other Services       Functional Status Assessment Patient has had a recent decline in their functional status and demonstrates the ability to make significant improvements in function in a reasonable and predictable amount of time.      Precautions / Restrictions Precautions Precautions: Fall Precaution Comments: COVID+ Restrictions Weight Bearing Restrictions: No      Mobility  Bed Mobility Overal bed mobility: Modified Independent             General bed mobility comments: increased time, use of railing    Transfers Overall transfer level: Needs assistance Equipment used: None Transfers: Sit to/from Stand Sit to Stand: Supervision                Ambulation/Gait Ambulation/Gait assistance: Min guard Gait Distance (Feet): 10 Feet Assistive device: None Gait Pattern/deviations: Step-to pattern Gait velocity: reduced Gait velocity interpretation: <1.31 ft/sec, indicative of household ambulator   General Gait Details: pt with slowed step-to gait, reduced gait speed  Stairs            Wheelchair Mobility    Modified Rankin (Stroke Patients Only)       Balance Overall balance assessment: Needs assistance Sitting-balance support: No upper extremity supported, Feet supported Sitting balance-Leahy Scale: Good     Standing balance support: No upper extremity supported, During functional activity Standing balance-Leahy Scale: Fair                               Pertinent Vitals/Pain Pain Assessment Pain Assessment: No/denies pain    Home Living Family/patient expects to be discharged to:: Private residence Living Arrangements: Spouse/significant other Available Help at Discharge: Family;Available 24 hours/day (weekend) Type of Home: House Home Access: Stairs to enter Entrance Stairs-Rails: Can reach both Entrance Stairs-Number of Steps: 4   Home Layout: Two level;Able to live on main level with bedroom/bathroom Home Equipment: None      Prior Function Prior Level of Function : Independent/Modified Independent;Working/employed;Driving  Mobility Comments: works as Astronomer for [employer]         Extremity/Trunk Assessment   Upper Extremity Assessment Upper Extremity Assessment: Generalized weakness    Lower Extremity Assessment Lower Extremity Assessment: Generalized weakness    Cervical / Trunk Assessment Cervical / Trunk Assessment: Normal  Communication   Communication: No difficulties  Cognition Arousal/Alertness: Awake/alert Behavior During Therapy: Flat affect Overall Cognitive Status: Within Functional Limits for tasks assessed                                          General Comments General comments (skin integrity, edema, etc.): pt with tachypnea up to 40s with ambulation, recovers once seated. PT provides reinforcement for slow and deep breaths    Exercises     Assessment/Plan    PT Assessment Patient needs continued PT services  PT Problem List Decreased strength;Decreased balance;Decreased activity tolerance;Decreased mobility;Cardiopulmonary status limiting activity       PT Treatment Interventions DME instruction;Gait training;Stair training;Functional mobility training;Therapeutic activities;Therapeutic exercise;Balance training;Patient/family education    PT Goals (Current goals can be found in the Care Plan section)  Acute Rehab PT Goals Patient Stated Goal: to return to prior level of function and feel better PT Goal Formulation: With patient Time For Goal Achievement: 01/04/22 Potential to Achieve Goals: Good    Frequency Min 3X/week     Co-evaluation               AM-PAC PT "6 Clicks" Mobility  Outcome Measure Help needed turning from your back to your side while in a flat bed without using bedrails?: None Help needed moving from lying on your back to sitting on the side of a flat bed without using bedrails?: None Help needed moving to and from a bed to a chair (including a wheelchair)?: A Little Help needed standing up from a chair using your arms (e.g., wheelchair or bedside chair)?: A Little Help needed to  walk in hospital room?: A Little Help needed climbing 3-5 steps with a railing? : A Little 6 Click Score: 20    End of Session   Activity Tolerance: Patient limited by fatigue Patient left: in bed;with call bell/phone within reach;with family/visitor present Nurse Communication: Mobility status PT Visit Diagnosis: Other abnormalities of gait and mobility (R26.89);Muscle weakness (generalized) (M62.81)    Time: 1040-1105 PT Time Calculation (min) (ACUTE ONLY): 25 min   Charges:   PT Evaluation $PT Eval Low Complexity: 1 Low          Arlyss Gandy, PT, DPT Acute Rehabilitation Office [phone]   Arlyss Gandy 12/21/2021, 11:12 AM

## 2021-12-21 NOTE — Assessment & Plan Note (Signed)
Currently not wheezing.  We will continue as needed DuoNebs.

## 2021-12-21 NOTE — Subjective & Objective (Signed)
CC: covid, SOB HPI: 46 year old female history of migraine headaches presents the ER today due to shortness of breath.  She had dropped her son off with her husband in [place] at the end of August 2023.  She returned to [facility] on 22 August.  Both her and her husband started feeling ill.  She took a home test for COVID on 24 August.  It was positive.  She had a telehealth visit with her primary care doctor who prescribed Paxlovid for 5 days along with Medrol Dosepak.  Patient was having fever, chills, myalgias and diarrhea.  Patient did start the Paxlovid and did finish it.  She states that while she was on it she was feeling better.  Her last dose of Paxlovid was on the 29th.  After stopping the Paxlovid, she started feeling worse.  She started having increasing shortness of breath.  She was seen again on the 29th in the office and sent to the ER.  She was diagnosed with an asthma exacerbation.  She was placed on prednisone for 4 days.  She is continue to be short of breath.  She has been using nebulizer treatments every 4 hours without improvement in her symptoms.  She was seen again in the primary care office on 12/20/2021 and sent again to the ER.  On arrival to the ER, she was in mild respiratory extremis with tachypnea with a respiratory rate of 27.  She was placed on supplemental oxygen for comfort.  Initial labs showed a white count of 19.2 platelets of 390  BNP was normal at 51.9  Venous pH 7.6 PCO2 of 20 PO2 30  Lactic acid of 5.2  Her COVID test remained positive  CTPA was negative for pulmonary embolism there is no focal consolidation or pneumonia  Due to continued respiratory distress, patient transferred to [facility] for further care.

## 2021-12-21 NOTE — Progress Notes (Signed)
PROGRESS NOTE                                                                                                                                                                                                             Patient Demographics:    Stacy Ochoa, is a 46 y.o. female, DOB - [birth year], WGN:[id number]  Outpatient Primary MD for the patient is Stevphen Rochester, MD    LOS - 0  Admit date - 12/20/2021    Chief Complaint  Patient presents with   Shortness of Breath       Brief Narrative (HPI from H&P)   47 year old female history of migraine headaches presents the ER today due to shortness of breath.  She had dropped her son off with her husband in [place] at the end of August 2023.  She returned to [facility] on 22 August.  Both her and her husband started feeling ill.  She took a home test for COVID on 24 August.  It was positive, she had seen a PCP who started her on Paxlovid and Decadron.  She finished her course of Paxlovid but continues to be short of breath with generalized weakness and she was admitted to the hospital for further care.   Subjective:    Stacy Ochoa today has, No headache, No chest pain, No abdominal pain - No Nausea, No new weakness tingling or numbness, mild shortness of breath but generalized weakness.   Assessment  & Plan :    Acute respiratory failure  ?  More subjective.  She is currently saturating well on room air, no visible respiratory distress, recent history of COVID-19 infection which is clinically treated after Paxlovid course, inflammatory markers are stable, CTA unremarkable.  Advance activity, encouraged to sit up in chair use I-S and flutter valve for pulmonary toiletry.  Monitor.  COVID-19 virus infection - Patient is about 8 days from her initial diagnosis of COVID.  She is status post 5 days of Paxlovid.Marland Kitchen  Please see above  Mild intermittent asthma with acute exacerbation  - resolved, no wheezing at all, stopped steroids.  Transaminitis.  Mild likely due to Paxlovid.  Asymptomatic, check ultrasound, trend.  Check INR.  Severe generalized weakness.  Hydrate with IVF, check TSH, B12, PT eval.       Condition - Extremely Guarded  Family Communication  :  None present  Code Status :  Full  Consults  :  None  PUD Prophylaxis :    Procedures  :     CTA - There is no evidence of central pulmonary artery embolism. There is no evidence of thoracic aortic dissection. There is no focal pulmonary consolidation.      Disposition Plan  :    Status is: Observation  DVT Prophylaxis  :    heparin injection 5,000 Units Start: 12/21/21 0600 SCDs Start: 12/21/21 0147    Lab Results  Component Value Date   PLT 346 12/21/2021    Diet :  Diet Order             Diet regular Room service appropriate? Yes; Fluid consistency: Thin  Diet effective now                    Inpatient Medications  Scheduled Meds:  heparin  5,000 Units Subcutaneous Q8H   Continuous Infusions:  lactated ringers     PRN Meds:.acetaminophen, guaiFENesin-dextromethorphan, ipratropium-albuterol, melatonin, [DISCONTINUED] ondansetron **OR** ondansetron (ZOFRAN) IV, traZODone  Time Spent in minutes  30   Susa Raring M.D on 12/21/2021 at 10:15 AM  To page go to [email or web address]   [facility] -  Office  [phone]  See all Orders from today for further details    Objective:   Vitals:   12/20/21 2130 12/20/21 2230 12/20/21 2349 12/21/21 0415  BP: 130/77 113/78 (!) 164/83 125/83  Pulse: 95 78 64 62  Resp: 14 14 18 16   Temp:   98.6 F (37 C) 98.6 F (37 C)  TempSrc:   Oral Oral  SpO2: 98% 99% 99% 98%  Weight:   90.9 kg     Wt Readings from Last 3 Encounters:  12/20/21 90.9 kg  12/18/21 88.5 kg  04/12/21 88.9 kg    No intake or output data in the 24 hours ending 12/21/21 1015   Physical Exam  Awake Alert, No new F.N deficits, Normal  affect [place].AT,PERRAL Supple Neck, No JVD,   Symmetrical Chest wall movement, Good air movement bilaterally, CTAB RRR,No Gallops,Rubs or new Murmurs,  +ve B.Sounds, Abd Soft, No tenderness,   No Cyanosis, Clubbing or edema      Data Review:    CBC Recent Labs  Lab 12/18/21 1449 12/20/21 1526 12/20/21 1810 12/21/21 0708  WBC 15.1* 19.2*  --  14.1*  HGB 15.4* 14.8 15.0 14.5  HCT 44.4 43.3 44.0 42.9  PLT 426* 390  --  346  MCV 85.9 85.9  --  88.3  MCH 29.8 29.4  --  29.8  MCHC 34.7 34.2  --  33.8  RDW 12.2 12.5  --  12.7  LYMPHSABS  --  1.9  --   --   MONOABS  --  1.0  --   --   EOSABS  --  0.0  --   --   BASOSABS  --  0.0  --   --     Electrolytes Recent Labs  Lab 12/18/21 1449 12/20/21 1526 12/20/21 1805 12/20/21 1810 12/21/21 0708  NA 137 136  --  135 136  K 4.0 4.1  --  3.9 4.3  CL 101 101  --   --  101  CO2 23 20*  --   --  24  GLUCOSE 94 109*  --   --  112*  BUN 21* 21*  --   --  14  CREATININE 0.90 0.78  --   --  0.83  CALCIUM 9.6 9.6  --   --  9.4  AST  --  104*  --   --  49*  ALT  --  176*  --   --  159*  ALKPHOS  --  79  --   --  74  BILITOT  --  0.4  --   --  0.5  ALBUMIN  --  4.0  --   --  3.1*  MG  --   --   --   --  2.2  CRP  --   --   --   --  0.5  LATICACIDVEN  --   --  5.2*  --   --   BNP  --  51.9  --   --  49.5     ID Labs Recent Labs  Lab 12/18/21 1449 12/20/21 1526 12/20/21 1805 12/21/21 0708  WBC 15.1* 19.2*  --  14.1*  PLT 426* 390  --  346  CRP  --   --   --  0.5  LATICACIDVEN  --   --  5.2*  --   CREATININE 0.90 0.78  --  0.83    Radiology Reports DG Chest Port 1 View  Result Date: 12/21/2021 CLINICAL DATA:  Shortness of breath EXAM: PORTABLE CHEST 1 VIEW COMPARISON:  CTA chest 1 day prior FINDINGS: The cardiomediastinal silhouette is normal. There is no focal consolidation or pulmonary edema. There is no pleural effusion or pneumothorax There is no acute osseous abnormality. IMPRESSION: Stable chest with no radiographic  evidence of acute cardiopulmonary process. Electronically Signed   By: Lesia Hausen M.D.   On: 12/21/2021 08:35   CT Angio Chest PE W and/or Wo Contrast  Result Date: 12/20/2021 CLINICAL DATA:  Shortness of breath, high clinical suspicion for pulmonary embolism EXAM: CT ANGIOGRAPHY CHEST WITH CONTRAST TECHNIQUE: Multidetector CT imaging of the chest was performed using the standard protocol during bolus administration of intravenous contrast. Multiplanar CT image reconstructions and MIPs were obtained to evaluate the vascular anatomy. RADIATION DOSE REDUCTION: This exam was performed according to the departmental dose-optimization program which includes automated exposure control, adjustment of the mA and/or kV according to patient size and/or use of iterative reconstruction technique. CONTRAST:  75mL OMNIPAQUE IOHEXOL 350 MG/ML SOLN COMPARISON:  Previous CT done on 12/12/2017 and chest radiograph done on 12/18/2021 FINDINGS: Cardiovascular: There is homogeneous enhancement in thoracic aorta. Right subclavian arteries arising from the left side of arch and is traversing the posterior mediastinum behind the esophagus. There are no intraluminal filling defects in central pulmonary artery branches. Evaluation of small peripheral branches in the lower lung fields is limited by motion artifacts. Mediastinum/Nodes: No significant lymphadenopathy is seen. Lungs/Pleura: There is no focal pulmonary consolidation. Breathing motion limits evaluation of lower lung fields. There is no pleural effusion or pneumothorax. Upper Abdomen: No acute findings are seen. Musculoskeletal: Unremarkable. Review of the MIP images confirms the above findings. IMPRESSION: There is no evidence of central pulmonary artery embolism. There is no evidence of thoracic aortic dissection. There is no focal pulmonary consolidation. Electronically Signed   By: Ernie Avena M.D.   On: 12/20/2021 16:10   DG Chest 2 View  Result Date:  12/18/2021 CLINICAL DATA:  COVID + on 8/24, worsening SOB, body aches, weakness, chills, and cough that started today EXAM: CHEST - 2 VIEW COMPARISON:  12/12/2017 FINDINGS: Lungs are clear. Heart size and mediastinal contours are within normal limits. No effusion.  No pneumothorax. Visualized bones  unremarkable. IMPRESSION: No acute cardiopulmonary disease. Electronically Signed   By: Corlis Leak M.D.   On: 12/18/2021 15:08

## 2021-12-21 NOTE — Assessment & Plan Note (Signed)
Observation telemetry bed.  Continue with supplemental oxygen for comfort.  Current O2 saturations are 100%.  She still has exertional dyspnea likely due to COVID rebound after Paxlovid.

## 2021-12-21 NOTE — Assessment & Plan Note (Signed)
Patient is about 8 days from her initial diagnosis of COVID.  She is status post 5 days of Paxlovid..  She is having COVID rebound after taking Paxlovid.  She is out of the window from remdesivir.  She is not immunosuppressed.  We will continue her on Decadron.  There is no pneumonia on her x-ray.  I will stop the Zosyn and vancomycin.  Check inflammatory markers.  Discussed with the patient that at this point now in the disease process, the only thing we can provide is supportive care.  I hope she does not get long COVID.

## 2021-12-21 NOTE — H&P (Signed)
History and Physical    Stacy Ochoa FAO:[id number] DOB: 26-May-1975 DOA: 12/20/2021  DOS: the patient was seen and examined on 12/20/2021  PCP: Stacy Rochester, MD   Patient coming from: Home  I have personally briefly reviewed patient's old medical records in [facility]  CC: covid, SOB HPI: 46 year old female history of migraine headaches presents the ER today due to shortness of breath.  She had dropped her son off with her husband in [place] at the end of August 2023.  She returned to [facility] on 22 August.  Both her and her husband started feeling ill.  She took a home test for COVID on 24 August.  It was positive.  She had a telehealth visit with her primary care doctor who prescribed Paxlovid for 5 days along with Medrol Dosepak.  Patient was having fever, chills, myalgias and diarrhea.  Patient did start the Paxlovid and did finish it.  She states that while she was on it she was feeling better.  Her last dose of Paxlovid was on the 29th.  After stopping the Paxlovid, she started feeling worse.  She started having increasing shortness of breath.  She was seen again on the 29th in the office and sent to the ER.  She was diagnosed with an asthma exacerbation.  She was placed on prednisone for 4 days.  She is continue to be short of breath.  She has been using nebulizer treatments every 4 hours without improvement in her symptoms.  She was seen again in the primary care office on 12/20/2021 and sent again to the ER.  On arrival to the ER, she was in mild respiratory extremis with tachypnea with a respiratory rate of 27.  She was placed on supplemental oxygen for comfort.  Initial labs showed a white count of 19.2 platelets of 390  BNP was normal at 51.9  Venous pH 7.6 PCO2 of 20 PO2 30  Lactic acid of 5.2  Her COVID test remained positive  CTPA was negative for pulmonary embolism there is no focal consolidation or pneumonia  Due to continued respiratory distress,  patient transferred to [facility] for further care.     ED Course: Arrived to the ER mild respiratory distress.  CTPA negative for PE or pneumonia.  Review of Systems:  Review of Systems  Constitutional:  Positive for malaise/fatigue. Negative for chills and fever.  HENT: Negative.    Eyes: Negative.   Respiratory:  Positive for shortness of breath.        Dyspnea on exertion.  Cardiovascular: Negative.   Gastrointestinal: Negative.  Negative for diarrhea.  Genitourinary: Negative.   Musculoskeletal: Negative.   Skin: Negative.   Neurological:  Positive for dizziness.  Endo/Heme/Allergies: Negative.   Psychiatric/Behavioral: Negative.    All other systems reviewed and are negative.   Past Medical History:  Diagnosis Date   Asthma    History of anemia 2019   due to heavy vag bleeding    Migraine with aura    gets ~ 1-2 a month - controlled with medication    PONV (postoperative nausea and vomiting)     Past Surgical History:  Procedure Laterality Date   BREAST REDUCTION SURGERY  1994   CYSTOSCOPY N/A 12/08/2017   Procedure: CYSTOSCOPY;  Surgeon: Stacy Bears, MD;  Location: [facility];  Service: Gynecology;  Laterality: N/A;   TOTAL LAPAROSCOPIC HYSTERECTOMY WITH SALPINGECTOMY Bilateral 12/08/2017   Procedure: TOTAL LAPAROSCOPIC HYSTERECTOMY WITH SALPINGECTOMY;  Surgeon: Stacy Ochoa,  Stacy Maxwell, MD;  Location: [facility];  Service: Gynecology;  Laterality: Bilateral;  specimen weight 233 g     reports that she has never smoked. She has never used smokeless tobacco. She reports that she does not drink alcohol and does not use drugs.  Allergies  Allergen Reactions   Aspirin Anaphylaxis   Nsaids Anaphylaxis   Shellfish Allergy Anaphylaxis    Family History  Problem Relation Age of Onset   Hypertension Mother    Diabetes Mother    Heart disease Maternal Grandfather    Osteoporosis Maternal Aunt    Colon polyps Brother 67        precancerous polyps    Prior to Admission medications   Medication Sig Start Date End Date Taking? Authorizing Provider  albuterol (PROVENTIL) (2.5 MG/3ML) 0.083% nebulizer solution Take 2.5 mg by nebulization every 4 (four) hours as needed for wheezing or shortness of breath.  04/25/16   [provider]  albuterol (VENTOLIN HFA) 108 (90 Base) MCG/ACT inhaler Inhale into the lungs daily as needed. 09/19/16 04/12/21  [provider]  albuterol (VENTOLIN HFA) 108 (90 Base) MCG/ACT inhaler Inhale 1-2 puffs into the lungs every 6 (six) hours as needed for wheezing or shortness of breath. 12/18/21   Stacy Ade, MD  Ascorbic Acid (VITAMIN C PO) Take by mouth.    [provider]  Cyanocobalamin (B-12 PO) Take by mouth.    [provider]  EPINEPHrine (EPIPEN 2-PAK) 0.3 mg/0.3 mL IJ SOAJ injection Inject 0.3 mg into the muscle once as needed (anaphylaxis).  Patient not taking: Reported on 10/26/2019 02/05/15   [provider]  fluticasone (FLONASE) 50 MCG/ACT nasal spray Place 2 sprays into both nostrils 2 (two) times daily.  09/02/17   [provider]  ipratropium-albuterol (DUONEB) 0.5-2.5 (3) MG/3ML SOLN Take 3 mLs by nebulization every 4 (four) hours as needed for up to 20 days. 12/18/21 01/07/22  Stacy Ade, MD  NURTEC 75 MG TBDP Take by mouth. 08/03/19   [provider]  predniSONE (DELTASONE) 10 MG tablet Take 4 tablets (40 mg total) by mouth daily for 4 days, THEN 2 tablets (20 mg total) daily for 4 days, THEN 1 tablet (10 mg total) daily for 4 days. 12/18/21 12/30/21  Stacy Ade, MD  QULIPTA 60 MG TABS Take 1 tablet by mouth daily. 03/29/21   [provider]  VITAMIN D PO Take 5,000 Int'l Units by mouth.    [provider]    Physical Exam: Vitals:   12/20/21 2100 12/20/21 2130 12/20/21 2230 12/20/21 2349  BP: 129/85 130/77 113/78 (!) 164/83  Pulse: 66 95 78 64  Resp: 12 14 14 18   Temp:    98.6 F (37  C)  TempSrc:    Oral  SpO2: 98% 98% 99% 99%  Weight:    90.9 kg    Physical Exam Vitals and nursing note reviewed.  Constitutional:      General: She is not in acute distress.    Appearance: She is not ill-appearing, toxic-appearing or diaphoretic.  HENT:     Head: Normocephalic and atraumatic.  Eyes:     Pupils: Pupils are equal, round, and reactive to light.  Cardiovascular:     Rate and Rhythm: Normal rate and regular rhythm.     Pulses: Normal pulses.  Pulmonary:     Effort: Tachypnea present. No respiratory distress.     Breath sounds: No rales.  Abdominal:     General: Bowel sounds  are normal. There is no distension.     Tenderness: There is no abdominal tenderness. There is no guarding or rebound.  Musculoskeletal:     Right lower leg: No edema.     Left lower leg: No edema.  Skin:    General: Skin is warm and dry.     Capillary Refill: Capillary refill takes less than 2 seconds.  Neurological:     General: No focal deficit present.     Mental Status: She is alert and oriented to person, place, and time.      Labs on Admission: I have personally reviewed following labs and imaging studies  CBC: Recent Labs  Lab 12/18/21 1449 12/20/21 1526 12/20/21 1810  WBC 15.1* 19.2*  --   NEUTROABS  --  16.1*  --   HGB 15.4* 14.8 15.0  HCT 44.4 43.3 44.0  MCV 85.9 85.9  --   PLT 426* 390  --    Basic Metabolic Panel: Recent Labs  Lab 12/18/21 1449 12/20/21 1526 12/20/21 1810  NA 137 136 135  K 4.0 4.1 3.9  CL 101 101  --   CO2 23 20*  --   GLUCOSE 94 109*  --   BUN 21* 21*  --   CREATININE 0.90 0.78  --   CALCIUM 9.6 9.6  --    GFR: Estimated Creatinine Clearance: 98.7 mL/min (by C-G formula based on SCr of 0.78 mg/dL). Liver Function Tests: Recent Labs  Lab 12/20/21 1526  AST 104*  ALT 176*  ALKPHOS 79  BILITOT 0.4  PROT 7.0  ALBUMIN 4.0   No results for input(s): "LIPASE", "AMYLASE" in the last 168 hours. No results for input(s): "AMMONIA"  in the last 168 hours. Coagulation Profile: No results for input(s): "INR", "PROTIME" in the last 168 hours. Cardiac Enzymes: Recent Labs  Lab 12/18/21 1449 12/18/21 1700 12/20/21 1526 12/20/21 1735  TROPONINIHS 4 2 3  <2   BNP (last 3 results) No results for input(s): "PROBNP" in the last 8760 hours. HbA1C: No results for input(s): "HGBA1C" in the last 72 hours. CBG: No results for input(s): "GLUCAP" in the last 168 hours. Lipid Profile: No results for input(s): "CHOL", "HDL", "LDLCALC", "TRIG", "CHOLHDL", "LDLDIRECT" in the last 72 hours. Thyroid Function Tests: No results for input(s): "TSH", "T4TOTAL", "FREET4", "T3FREE", "THYROIDAB" in the last 72 hours. Anemia Panel: No results for input(s): "VITAMINB12", "FOLATE", "FERRITIN", "TIBC", "IRON", "RETICCTPCT" in the last 72 hours. Urine analysis:    Component Value Date/Time   COLORURINE COLORLESS (A) 12/20/2021 1934   APPEARANCEUR CLEAR 12/20/2021 1934   LABSPEC 1.035 (H) 12/20/2021 1934   PHURINE 7.5 12/20/2021 1934   GLUCOSEU NEGATIVE 12/20/2021 1934   HGBUR NEGATIVE 12/20/2021 1934   BILIRUBINUR NEGATIVE 12/20/2021 1934   BILIRUBINUR N 12/12/2017 1152   KETONESUR NEGATIVE 12/20/2021 1934   PROTEINUR NEGATIVE 12/20/2021 1934   UROBILINOGEN 0.2 12/12/2017 1152   NITRITE NEGATIVE 12/20/2021 1934   LEUKOCYTESUR NEGATIVE 12/20/2021 1934    Radiological Exams on Admission: I have personally reviewed images CT Angio Chest PE W and/or Wo Contrast  Result Date: 12/20/2021 CLINICAL DATA:  Shortness of breath, high clinical suspicion for pulmonary embolism EXAM: CT ANGIOGRAPHY CHEST WITH CONTRAST TECHNIQUE: Multidetector CT imaging of the chest was performed using the standard protocol during bolus administration of intravenous contrast. Multiplanar CT image reconstructions and MIPs were obtained to evaluate the vascular anatomy. RADIATION DOSE REDUCTION: This exam was performed according to the departmental dose-optimization  program which includes automated exposure control,  adjustment of the mA and/or kV according to patient size and/or use of iterative reconstruction technique. CONTRAST:  75mL OMNIPAQUE IOHEXOL 350 MG/ML SOLN COMPARISON:  Previous CT done on 12/12/2017 and chest radiograph done on 12/18/2021 FINDINGS: Cardiovascular: There is homogeneous enhancement in thoracic aorta. Right subclavian arteries arising from the left side of arch and is traversing the posterior mediastinum behind the esophagus. There are no intraluminal filling defects in central pulmonary artery branches. Evaluation of small peripheral branches in the lower lung fields is limited by motion artifacts. Mediastinum/Nodes: No significant lymphadenopathy is seen. Lungs/Pleura: There is no focal pulmonary consolidation. Breathing motion limits evaluation of lower lung fields. There is no pleural effusion or pneumothorax. Upper Abdomen: No acute findings are seen. Musculoskeletal: Unremarkable. Review of the MIP images confirms the above findings. IMPRESSION: There is no evidence of central pulmonary artery embolism. There is no evidence of thoracic aortic dissection. There is no focal pulmonary consolidation. Electronically Signed   By: Ernie Avena M.D.   On: 12/20/2021 16:10    EKG: My personal interpretation of EKG shows: NSR    Assessment/Plan Principal Problem:   Acute respiratory failure (HCC) Active Problems:   COVID-19 virus infection   Mild intermittent asthma with acute exacerbation    Assessment and Plan: * Acute respiratory failure (HCC) Observation telemetry bed.  Continue with supplemental oxygen for comfort.  Current O2 saturations are 100%.  She still has exertional dyspnea likely due to COVID rebound after Paxlovid.  COVID-19 virus infection Patient is about 8 days from her initial diagnosis of COVID.  She is status post 5 days of Paxlovid..  She is having COVID rebound after taking Paxlovid.  She is out of the  window from remdesivir.  She is not immunosuppressed.  We will continue her on Decadron.  There is no pneumonia on her x-ray.  I will stop the Zosyn and vancomycin.  Check inflammatory markers.  Discussed with the patient that at this point now in the disease process, the only thing we can provide is supportive care.  I hope she does not get long COVID.  Mild intermittent asthma with acute exacerbation Currently not wheezing.  We will continue as needed DuoNebs.   DVT prophylaxis: SQ Heparin Code Status: Full Code Family Communication: no family at bedside  Disposition Plan: return home  Consults called: none  Admission status: Observation, Telemetry bed   Carollee Herter, DO [facility] 12/21/2021, 1:26 AM

## 2021-12-22 DIAGNOSIS — J9600 Acute respiratory failure, unspecified whether with hypoxia or hypercapnia: Secondary | ICD-10-CM | POA: Diagnosis not present

## 2021-12-22 LAB — CBC WITH DIFFERENTIAL/PLATELET
Abs Immature Granulocytes: 0 10*3/uL (ref 0.00–0.07)
Basophils Absolute: 0 10*3/uL (ref 0.0–0.1)
Basophils Relative: 0 %
Eosinophils Absolute: 0 10*3/uL (ref 0.0–0.5)
Eosinophils Relative: 0 %
HCT: 42.3 % (ref 36.0–46.0)
Hemoglobin: 14.3 g/dL (ref 12.0–15.0)
Lymphocytes Relative: 5 %
Lymphs Abs: 0.8 10*3/uL (ref 0.7–4.0)
MCH: 29.9 pg (ref 26.0–34.0)
MCHC: 33.8 g/dL (ref 30.0–36.0)
MCV: 88.3 fL (ref 80.0–100.0)
Monocytes Absolute: 0.5 10*3/uL (ref 0.1–1.0)
Monocytes Relative: 3 %
Neutro Abs: 14.9 10*3/uL — ABNORMAL HIGH (ref 1.7–7.7)
Neutrophils Relative %: 92 %
Platelets: 322 10*3/uL (ref 150–400)
RBC: 4.79 MIL/uL (ref 3.87–5.11)
RDW: 12.6 % (ref 11.5–15.5)
WBC: 16.2 10*3/uL — ABNORMAL HIGH (ref 4.0–10.5)
nRBC: 0 % (ref 0.0–0.2)
nRBC: 0 /100 WBC

## 2021-12-22 LAB — COMPREHENSIVE METABOLIC PANEL
ALT: 99 U/L — ABNORMAL HIGH (ref 0–44)
AST: 19 U/L (ref 15–41)
Albumin: 2.9 g/dL — ABNORMAL LOW (ref 3.5–5.0)
Alkaline Phosphatase: 69 U/L (ref 38–126)
Anion gap: 7 (ref 5–15)
BUN: 19 mg/dL (ref 6–20)
CO2: 25 mmol/L (ref 22–32)
Calcium: 9.3 mg/dL (ref 8.9–10.3)
Chloride: 104 mmol/L (ref 98–111)
Creatinine, Ser: 0.84 mg/dL (ref 0.44–1.00)
GFR, Estimated: >60 mL/min (ref >60)
Glucose, Bld: 101 mg/dL — ABNORMAL HIGH (ref 70–99)
Potassium: 4.1 mmol/L (ref 3.5–5.1)
Sodium: 136 mmol/L (ref 135–145)
Total Bilirubin: 0.5 mg/dL (ref 0.3–1.2)
Total Protein: 5.8 g/dL — ABNORMAL LOW (ref 6.5–8.1)

## 2021-12-22 LAB — T4, FREE: Free T4: 0.94 ng/dL (ref 0.61–1.12)

## 2021-12-22 LAB — PROCALCITONIN: Procalcitonin: <0.1 ng/mL

## 2021-12-22 LAB — MAGNESIUM: Magnesium: 2.2 mg/dL (ref 1.7–2.4)

## 2021-12-22 LAB — BRAIN NATRIURETIC PEPTIDE: B Natriuretic Peptide: 24.7 pg/mL (ref 0.0–100.0)

## 2021-12-22 LAB — C-REACTIVE PROTEIN: CRP: 0.6 mg/dL (ref <1.0)

## 2021-12-22 NOTE — Discharge Instructions (Signed)
Follow with Primary MD Jolinda Croak, MD in 7 days   Get CBC, CMP, Magnesium, 2 view Chest X ray -  checked next visit within 1 week by Primary MD   Activity: As tolerated with Full fall precautions use walker/cane & assistance as needed  Disposition Home    Diet: Heart Healthy    Special Instructions: If you have smoked or chewed Tobacco  in the last 2 yrs please stop smoking, stop any regular Alcohol  and or any Recreational drug use.  On your next visit with your primary care physician please Get Medicines reviewed and adjusted.  Please request your Prim.MD to go over all Hospital Tests and Procedure/Radiological results at the follow up, please get all Hospital records sent to your Prim MD by signing hospital release before you go home.  If you experience worsening of your admission symptoms, develop shortness of breath, life threatening emergency, suicidal or homicidal thoughts you must seek medical attention immediately by calling 911 or calling your MD immediately  if symptoms less severe.  You Must read complete instructions/literature along with all the possible adverse reactions/side effects for all the Medicines you take and that have been prescribed to you. Take any new Medicines after you have completely understood and accpet all the possible adverse reactions/side effects.

## 2021-12-22 NOTE — Discharge Summary (Signed)
Stacy Ochoa QMV:[id number] DOB: [birth year] DOA: 12/20/2021  PCP: Stevphen Rochester, MD  Admit date: 12/20/2021  Discharge date: 12/22/2021  Admitted From: Home   Disposition:  Home   Recommendations for Outpatient Follow-up:   Follow up with PCP in 1-2 weeks  PCP Please obtain BMP/CBC, 2 view CXR in 1week,  (see Discharge instructions)   PCP Please follow up on the following pending results: Please check TSH, free T4, CBC, CMP, magnesium along with 2-week chest x-ray in 7 to 10 days.   Home Health: None   Equipment/Devices: None  Consultations: None  Discharge Condition: Stable    CODE STATUS: Full    Diet Recommendation: Heart Healthy     Chief Complaint  Patient presents with   Shortness of Breath     Brief history of present illness from the day of admission and additional interim summary    46 year old female history of migraine headaches presents the ER today due to shortness of breath.  She had dropped her son off with her husband in [place] at the end of August 2023.  She returned to [facility] on 22 August.  Both her and her husband started feeling ill.  She took a home test for COVID on 24 August.  It was positive, she had seen a PCP who started her on Paxlovid and Decadron.  She finished her course of Paxlovid but continues to be short of breath with generalized weakness and she was admitted to the hospital for further care.                                                                  Hospital Course   Acute respiratory failure  ?  More subjective.  She is currently saturating well on room air, no visible respiratory distress, recent history of COVID-19 infection which is clinically treated after Paxlovid course, inflammatory markers are stable, CTA unremarkable.  Her main issue was  dehydration which resolved with IV fluids, after receiving IV fluid she is feeling much better stable and symptom-free on room air worked well with PT eager to go home will be discharged home, steroid course has been discontinued as her inflammatory markers had stabilized upon admission.   COVID-19 virus infection - Patient is about 8 days from her initial diagnosis of COVID.  She is status post 5 days of Paxlovid.Marland Kitchen  Please see above   Mild intermittent asthma with acute exacerbation - resolved, no wheezing at all, stopped steroids.   Transaminitis.  Mild likely due to Paxlovid.  Asymptomatic, check ultrasound, trend.  Stable INR PCP to repeat CMP in 7 to 10 days.   Severe generalized weakness.  Hydrated with IV fluids, stable B12, stable free T4, TSH was mildly suppressed likely 6 years right request PCP to recheck TSH in  4 to 6 weeks.  Discharge diagnosis     Principal Problem:   Acute respiratory failure (HCC) Active Problems:   COVID-19 virus infection   Mild intermittent asthma with acute exacerbation    Discharge instructions    Discharge Instructions     Diet - low sodium heart healthy   Complete by: As directed    Discharge instructions   Complete by: As directed    Follow with Primary MD Stevphen Rochester, MD in 7 days   Get CBC, CMP, Magnesium, 2 view Chest X ray -  checked next visit within 1 week by Primary MD   Activity: As tolerated with Full fall precautions use walker/cane & assistance as needed  Disposition Home    Diet: Heart Healthy    Special Instructions: If you have smoked or chewed Tobacco  in the last 2 yrs please stop smoking, stop any regular Alcohol  and or any Recreational drug use.  On your next visit with your primary care physician please Get Medicines reviewed and adjusted.  Please request your Prim.MD to go over all Hospital Tests and Procedure/Radiological results at the follow up, please get all Hospital records sent to your Prim MD by  signing hospital release before you go home.  If you experience worsening of your admission symptoms, develop shortness of breath, life threatening emergency, suicidal or homicidal thoughts you must seek medical attention immediately by calling 911 or calling your MD immediately  if symptoms less severe.  You Must read complete instructions/literature along with all the possible adverse reactions/side effects for all the Medicines you take and that have been prescribed to you. Take any new Medicines after you have completely understood and accpet all the possible adverse reactions/side effects.   Increase activity slowly   Complete by: As directed        Discharge Medications   Allergies as of 12/22/2021       Reactions   Aspirin Anaphylaxis   Nsaids Anaphylaxis   Shellfish Allergy Anaphylaxis        Medication List     STOP taking these medications    predniSONE 10 MG tablet Commonly known as: DELTASONE       TAKE these medications    albuterol 108 (90 Base) MCG/ACT inhaler Commonly known as: VENTOLIN HFA Inhale 1-2 puffs into the lungs every 6 (six) hours as needed for wheezing or shortness of breath.   B-12 PO Take by mouth.   fluticasone 50 MCG/ACT nasal spray Commonly known as: FLONASE Place 2 sprays into both nostrils 2 (two) times daily.   ipratropium-albuterol 0.5-2.5 (3) MG/3ML Soln Commonly known as: DUONEB Take 3 mLs by nebulization every 4 (four) hours as needed for up to 20 days.   Nurtec 75 MG Tbdp Generic drug: Rimegepant Sulfate Take by mouth.   Qulipta 60 MG Tabs Generic drug: Atogepant Take 1 tablet by mouth daily.   VITAMIN C PO Take by mouth.   VITAMIN D-3 PO Take 1 capsule by mouth daily.         Follow-up Information     Stevphen Rochester, MD. Schedule an appointment as soon as possible for a visit in 1 week(s).   Specialty: Family Medicine Contact information: [address] [place]  [address] [phone]                 Major procedures and Radiology Reports - PLEASE review detailed and final reports thoroughly  -  US Abdomen Limited RUQ (LIVER/GB)  Result Date: 12/21/2021 CLINICAL DATA:  Transaminitis EXAM: ULTRASOUND ABDOMEN LIMITED RIGHT UPPER QUADRANT COMPARISON:  None Available. FINDINGS: Gallbladder: Mildly contracted gallbladder. No gallstones or wall thickening visualized. No sonographic Murphy sign noted by sonographer. Common bile duct: Diameter: 0.3 cm Liver: No focal lesion identified. Within normal limits in parenchymal echogenicity. Portal vein is patent on color Doppler imaging with normal direction of blood flow towards the liver. Other: None. IMPRESSION: No ultrasound finding to explain transaminitis. Consider CT or MRI to further evaluate. Electronically Signed   By: Jearld Lesch M.D.   On: 12/21/2021 12:55   DG Chest Port 1 View  Result Date: 12/21/2021 CLINICAL DATA:  Shortness of breath EXAM: PORTABLE CHEST 1 VIEW COMPARISON:  CTA chest 1 day prior FINDINGS: The cardiomediastinal silhouette is normal. There is no focal consolidation or pulmonary edema. There is no pleural effusion or pneumothorax There is no acute osseous abnormality. IMPRESSION: Stable chest with no radiographic evidence of acute cardiopulmonary process. Electronically Signed   By: Lesia Hausen M.D.   On: 12/21/2021 08:35   CT Angio Chest PE W and/or Wo Contrast  Result Date: 12/20/2021 CLINICAL DATA:  Shortness of breath, high clinical suspicion for pulmonary embolism EXAM: CT ANGIOGRAPHY CHEST WITH CONTRAST TECHNIQUE: Multidetector CT imaging of the chest was performed using the standard protocol during bolus administration of intravenous contrast. Multiplanar CT image reconstructions and MIPs were obtained to evaluate the vascular anatomy. RADIATION DOSE REDUCTION: This exam was performed according to the departmental dose-optimization program which includes automated  exposure control, adjustment of the mA and/or kV according to patient size and/or use of iterative reconstruction technique. CONTRAST:  75mL OMNIPAQUE IOHEXOL 350 MG/ML SOLN COMPARISON:  Previous CT done on 12/12/2017 and chest radiograph done on 12/18/2021 FINDINGS: Cardiovascular: There is homogeneous enhancement in thoracic aorta. Right subclavian arteries arising from the left side of arch and is traversing the posterior mediastinum behind the esophagus. There are no intraluminal filling defects in central pulmonary artery branches. Evaluation of small peripheral branches in the lower lung fields is limited by motion artifacts. Mediastinum/Nodes: No significant lymphadenopathy is seen. Lungs/Pleura: There is no focal pulmonary consolidation. Breathing motion limits evaluation of lower lung fields. There is no pleural effusion or pneumothorax. Upper Abdomen: No acute findings are seen. Musculoskeletal: Unremarkable. Review of the MIP images confirms the above findings. IMPRESSION: There is no evidence of central pulmonary artery embolism. There is no evidence of thoracic aortic dissection. There is no focal pulmonary consolidation. Electronically Signed   By: Ernie Avena M.D.   On: 12/20/2021 16:10   DG Chest 2 View  Result Date: 12/18/2021 CLINICAL DATA:  COVID + on 8/24, worsening SOB, body aches, weakness, chills, and cough that started today EXAM: CHEST - 2 VIEW COMPARISON:  12/12/2017 FINDINGS: Lungs are clear. Heart size and mediastinal contours are within normal limits. No effusion.  No pneumothorax. Visualized bones unremarkable. IMPRESSION: No acute cardiopulmonary disease. Electronically Signed   By: Corlis Leak M.D.   On: 12/18/2021 15:08    Micro Results    Recent Results (from the past 240 hour(s))  Blood culture (routine x 2)     Status: None (Preliminary result)   Collection Time: 12/20/21  6:55 PM   Specimen: BLOOD  Result Value Ref Range Status   Specimen Description   Final     BLOOD RIGHT ANTECUBITAL Performed at [facility], [address], [place], [place] [address]    Special Requests  Final    Blood Culture adequate volume BOTTLES DRAWN AEROBIC AND ANAEROBIC Performed at [facility], [address], [place], [place] [address]    Culture   Final    NO GROWTH 2 DAYS Performed at [facility], [address] [place], [place] [address]    Report Status PENDING  Incomplete  Blood culture (routine x 2)     Status: None (Preliminary result)   Collection Time: 12/20/21  6:58 PM   Specimen: BLOOD  Result Value Ref Range Status   Specimen Description   Final    BLOOD BLOOD RIGHT HAND Performed at [facility], [address] [place], [place] [address]    Special Requests   Final    Blood Culture adequate volume BOTTLES DRAWN AEROBIC AND ANAEROBIC Performed at [facility], [address] [place], [place] [address]    Culture   Final    NO GROWTH 2 DAYS Performed at [facility], [address], [place], [place] [address]    Report Status PENDING  Incomplete  SARS Coronavirus 2 by RT PCR (hospital order, performed in [facility] hospital lab) *cepheid single result test* Anterior Nasal Swab     Status: Abnormal   Collection Time: 12/20/21  7:26 PM   Specimen: Anterior Nasal Swab  Result Value Ref Range Status   SARS Coronavirus 2 by RT PCR POSITIVE (A) NEGATIVE Final    Comment: (NOTE) SARS-CoV-2 target nucleic acids are DETECTED  SARS-CoV-2 RNA is generally detectable in upper respiratory specimens  during the acute phase of infection.  Positive results are indicative  of the presence of the identified virus, but do not rule out bacterial infection or co-infection with other pathogens not detected by the test.  Clinical correlation with patient history and  other diagnostic information is necessary to determine patient infection status.  The expected  result is negative.  Fact Sheet for Patients:   RoadLapTop.co.za   Fact Sheet for Healthcare Providers:   http://kim-miller.com/    This test is not yet approved or cleared by the [place] FDA and  has been authorized for detection and/or diagnosis of SARS-CoV-2 by FDA under an Emergency Use Authorization (EUA).  This EUA will remain in effect (meaning this test can be used) for the duration of  the COVID-19 declaration under Section 564(b)(1)  of the Act, 21 U.S.C. section 360-bbb-3(b)(1), unless the authorization is terminated or revoked sooner.   Performed at [facility], [address], [place], [place] [address]     Today   Subjective    Stacy Ochoa today has no headache,no chest abdominal pain,no new weakness tingling or numbness, feels much better wants to go home today.    Objective   Blood pressure 117/84, pulse (!) 56, temperature 98.2 F (36.8 C), temperature source Oral, resp. rate 16, weight 90.9 kg, last menstrual period 11/06/2017, SpO2 99 %.   Intake/Output Summary (Last 24 hours) at 12/22/2021 1112 Last data filed at 12/21/2021 1700 Gross per 24 hour  Intake 403.28 ml  Output --  Net 403.28 ml    Exam  Awake Alert, No new F.N deficits,    Silt.AT,PERRAL Supple Neck,   Symmetrical Chest wall movement, Good air movement bilaterally, CTAB RRR,No Gallops,   +ve B.Sounds, Abd Soft, Non tender,  No Cyanosis, Clubbing or edema    Data Review   Recent Labs  Lab 12/18/21 1449 12/20/21 1526 12/20/21 1810 12/21/21 0708 12/22/21 0609  WBC 15.1*  19.2*  --  14.1* 16.2*  HGB 15.4* 14.8 15.0 14.5 14.3  HCT 44.4 43.3 44.0 42.9 42.3  PLT 426* 390  --  346 322  MCV 85.9 85.9  --  88.3 88.3  MCH 29.8 29.4  --  29.8 29.9  MCHC 34.7 34.2  --  33.8 33.8  RDW 12.2 12.5  --  12.7 12.6  LYMPHSABS  --  1.9  --   --  0.8  MONOABS  --  1.0  --   --  0.5  EOSABS  --  0.0  --   --  0.0  BASOSABS   --  0.0  --   --  0.0    Recent Labs  Lab 12/18/21 1449 12/20/21 1526 12/20/21 1805 12/20/21 1810 12/21/21 0708 12/21/21 1054 12/22/21 0609  NA 137 136  --  135 136  --  136  K 4.0 4.1  --  3.9 4.3  --  4.1  CL 101 101  --   --  101  --  104  CO2 23 20*  --   --  24  --  25  GLUCOSE 94 109*  --   --  112*  --  101*  BUN 21* 21*  --   --  14  --  19  CREATININE 0.90 0.78  --   --  0.83  --  0.84  CALCIUM 9.6 9.6  --   --  9.4  --  9.3  AST  --  104*  --   --  49*  --  19  ALT  --  176*  --   --  159*  --  99*  ALKPHOS  --  79  --   --  74  --  69  BILITOT  --  0.4  --   --  0.5  --  0.5  ALBUMIN  --  4.0  --   --  3.1*  --  2.9*  MG  --   --   --   --  2.2  --  2.2  CRP  --   --   --   --  0.5  --  0.6  PROCALCITON  --   --   --   --  <0.10  --  <0.10  LATICACIDVEN  --   --  5.2*  --   --   --   --   INR  --   --   --   --   --  1.0  --   TSH  --   --   --   --   --  0.284*  --   BNP  --  51.9  --   --  49.5  --  24.7    Total Time in preparing paper work, data evaluation and todays exam - 35 minutes  Susa Raring M.D on 12/22/2021 at 11:12 AM  [facility]

## 2021-12-25 LAB — CULTURE, BLOOD (ROUTINE X 2)
Culture: NO GROWTH
Culture: NO GROWTH
Special Requests: ADEQUATE
Special Requests: ADEQUATE

## 2022-02-05 ENCOUNTER — Encounter: Payer: Self-pay | Admitting: Family

## 2022-02-28 ENCOUNTER — Encounter: Payer: Self-pay | Admitting: Family

## 2022-04-22 DIAGNOSIS — N340 Urethral abscess: Secondary | ICD-10-CM

## 2022-04-22 HISTORY — PX: CYSTOSCOPY: SUR368

## 2022-04-22 HISTORY — DX: Urethral abscess: N34.0

## 2022-06-19 ENCOUNTER — Other Ambulatory Visit: Payer: Self-pay

## 2022-06-19 DIAGNOSIS — Z90711 Acquired absence of uterus with remaining cervical stump: Secondary | ICD-10-CM

## 2022-06-20 ENCOUNTER — Encounter: Payer: Self-pay | Admitting: Family

## 2022-06-20 ENCOUNTER — Ambulatory Visit (HOSPITAL_BASED_OUTPATIENT_CLINIC_OR_DEPARTMENT_OTHER)
Admission: RE | Admit: 2022-06-20 | Discharge: 2022-06-20 | Disposition: A | Discharge status: Home / Self Care | Payer: 59 | Source: Ambulatory Visit | Attending: Family Medicine | Admitting: Family Medicine

## 2022-06-20 DIAGNOSIS — Z90711 Acquired absence of uterus with remaining cervical stump: Secondary | ICD-10-CM | POA: Insufficient documentation

## 2022-06-28 ENCOUNTER — Other Ambulatory Visit (HOSPITAL_BASED_OUTPATIENT_CLINIC_OR_DEPARTMENT_OTHER): Payer: Self-pay | Admitting: Urology

## 2022-06-28 DIAGNOSIS — N361 Urethral diverticulum: Secondary | ICD-10-CM

## 2022-07-04 ENCOUNTER — Ambulatory Visit (HOSPITAL_BASED_OUTPATIENT_CLINIC_OR_DEPARTMENT_OTHER)
Admission: RE | Admit: 2022-07-04 | Discharge: 2022-07-04 | Disposition: A | Discharge status: Home / Self Care | Payer: 59 | Source: Ambulatory Visit | Attending: Urology | Admitting: Urology

## 2022-07-04 DIAGNOSIS — N361 Urethral diverticulum: Secondary | ICD-10-CM | POA: Diagnosis present

## 2022-07-04 MED ORDER — GADOBUTROL 1 MMOL/ML IV SOLN
9.0000 mL | Freq: Once | INTRAVENOUS | Status: AC | PRN
Start: 2022-07-04 — End: 2022-07-04
  Administered 2022-07-04: 9 mL via INTRAVENOUS
  Filled 2022-07-04: qty 10

## 2022-07-24 ENCOUNTER — Other Ambulatory Visit (HOSPITAL_BASED_OUTPATIENT_CLINIC_OR_DEPARTMENT_OTHER): Payer: Self-pay | Admitting: Urology

## 2022-07-24 DIAGNOSIS — N361 Urethral diverticulum: Secondary | ICD-10-CM

## 2022-07-26 ENCOUNTER — Ambulatory Visit (HOSPITAL_BASED_OUTPATIENT_CLINIC_OR_DEPARTMENT_OTHER)
Admission: RE | Admit: 2022-07-26 | Discharge: 2022-07-26 | Disposition: A | Discharge status: Home / Self Care | Payer: 59 | Source: Ambulatory Visit | Attending: Urology | Admitting: Urology

## 2022-07-26 DIAGNOSIS — N361 Urethral diverticulum: Secondary | ICD-10-CM | POA: Insufficient documentation

## 2022-09-06 ENCOUNTER — Ambulatory Visit (INDEPENDENT_AMBULATORY_CARE_PROVIDER_SITE_OTHER): Payer: 59 | Admitting: Obstetrics & Gynecology

## 2022-09-06 ENCOUNTER — Encounter (HOSPITAL_BASED_OUTPATIENT_CLINIC_OR_DEPARTMENT_OTHER): Payer: Self-pay | Admitting: Obstetrics & Gynecology

## 2022-09-06 VITALS — BP 137/98 | HR 84 | Ht 66.0 in | Wt 197.2 lb

## 2022-09-06 DIAGNOSIS — N340 Urethral abscess: Secondary | ICD-10-CM | POA: Diagnosis not present

## 2022-09-06 MED ORDER — SULFAMETHOXAZOLE-TRIMETHOPRIM 800-160 MG PO TABS
1.0000 | ORAL_TABLET | Freq: Two times a day (BID) | ORAL | 0 refills | Status: DC
Start: 2022-09-06 — End: 2022-10-30

## 2022-09-06 NOTE — Progress Notes (Signed)
GYNECOLOGY  VISIT  CC:   vaginal pain  HPI: 47 y.o. G78P2002 Married White or Caucasian female here for complaint of fullness, tenderness and drainage beside urethra that has been intermittently a problem for several months.  She has been treated with antibiotics and had undergone pelvic ultrasound, renal ultrasound and pelvic MRI.  (These results reviewed as well as notes from Dr. Donzetta Kohut.)  Pt reports when she went for the MRI, she was not having symptoms.  She did see urologist in [place] (but no notes in Care Everywhere).  Cystoscopy was done.  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  Pt reports when there is drainage, there is more tenderness.  She has tried to look and feels like the right side is larger than the left of the urethra.     Past Medical History:  Diagnosis Date   Asthma    History of anemia 2019   due to heavy vag bleeding    Migraine with aura    gets ~ 1-2 a month - controlled with medication    PONV (postoperative nausea and vomiting)     MEDS:   Current Outpatient Medications on File Prior to Visit  Medication Sig Dispense Refill   albuterol (VENTOLIN HFA) 108 (90 Base) MCG/ACT inhaler Inhale 1-2 puffs into the lungs every 6 (six) hours as needed for wheezing or shortness of breath. 1 each 0   Ascorbic Acid (VITAMIN C PO) Take by mouth.     Cholecalciferol (VITAMIN D-3 PO) Take 1 capsule by mouth daily.     Cyanocobalamin (B-12 PO) Take by mouth.     fluticasone (FLONASE) 50 MCG/ACT nasal spray Place 2 sprays into both nostrils 2 (two) times daily.      NURTEC 75 MG TBDP Take by mouth.     QULIPTA 60 MG TABS Take 1 tablet by mouth daily.     ipratropium-albuterol (DUONEB) 0.5-2.5 (3) MG/3ML SOLN Take 3 mLs by nebulization every 4 (four) hours as needed for up to 20 days. 360 mL 0   No current facility-administered medications on file prior to visit.    ALLERGIES: Aspirin, Nsaids, and Shellfish allergy  SH:   Married, non smoker  Review of Systems  Constitutional: Negative.   Genitourinary:  Negative for dysuria, frequency, hematuria and urgency.    PHYSICAL EXAMINATION:    BP (!) 137/98   Pulse 84   Ht 5\' 6"  (1.676 m)   Wt 197 lb 3.2 oz (89.4 kg)   LMP 11/06/2017 (Exact Date)   BMI 31.83 kg/m     General appearance: alert, cooperative and appears stated age  Lymph:  no inguinal LAD noted  Pelvic: External genitalia:  no lesions              Urethra:  normal appearing urethra with no masses, tenderness or lesions              Bartholins and Skenes: lateral to urethra on right is small opening and with compression of lesion purulent drainage is present                Vagina: normal appearing vagina with normal color and discharge, no lesions              In this picture, the small area with whitish/yellowish area is the opening draining the abscess.  I could not pass a q tip into this.  Just to the right is the urethral opening.  Chaperone, Raechel Ache, RN, was present for exam.  Assessment/Plan: 1. Skene's gland abscess - will send culture today, WOUND CULTURE - sulfamethoxazole-trimethoprim (BACTRIM DS) 800-160 MG tablet; Take 1 tablet by mouth 2 (two) times daily.  Dispense: 14 tablet; Refill: 0  - pt may need excision.  Would like to stay more locally for this.  Will decide about referral once culture is back and we see response to antibiotic therapy.

## 2022-09-10 LAB — WOUND CULTURE

## 2022-09-18 ENCOUNTER — Other Ambulatory Visit (HOSPITAL_BASED_OUTPATIENT_CLINIC_OR_DEPARTMENT_OTHER): Payer: Self-pay | Admitting: *Deleted

## 2022-09-18 DIAGNOSIS — N340 Urethral abscess: Secondary | ICD-10-CM

## 2022-09-27 ENCOUNTER — Encounter (HOSPITAL_BASED_OUTPATIENT_CLINIC_OR_DEPARTMENT_OTHER): Payer: Self-pay | Admitting: Obstetrics & Gynecology

## 2022-09-27 DIAGNOSIS — N340 Urethral abscess: Secondary | ICD-10-CM

## 2022-10-29 ENCOUNTER — Encounter: Payer: Self-pay | Admitting: Neurology

## 2022-10-29 ENCOUNTER — Ambulatory Visit: Payer: 59 | Admitting: Neurology

## 2022-10-29 VITALS — BP 136/97 | HR 73 | Ht 66.0 in | Wt 196.0 lb

## 2022-10-29 DIAGNOSIS — R0683 Snoring: Secondary | ICD-10-CM

## 2022-10-29 DIAGNOSIS — G43009 Migraine without aura, not intractable, without status migrainosus: Secondary | ICD-10-CM

## 2022-10-29 DIAGNOSIS — R400 Somnolence: Secondary | ICD-10-CM

## 2022-10-29 DIAGNOSIS — R519 Headache, unspecified: Secondary | ICD-10-CM

## 2022-10-29 MED ORDER — CYCLOBENZAPRINE HCL 5 MG PO TABS: 5.0000 mg | ORAL_TABLET | Freq: Three times a day (TID) | ORAL | 6 refills | Status: AC | PRN

## 2022-10-29 MED ORDER — BUPIVACAINE HCL 0.5 % IJ SOLN
3.0000 mL | Freq: Once | INTRAMUSCULAR | Status: DC
Start: 2022-10-29 — End: 2023-02-07

## 2022-10-29 MED ORDER — LIDOCAINE HCL 2 % IJ SOLN
3.0000 mL | Freq: Once | INTRAMUSCULAR | Status: DC
Start: 2022-10-29 — End: 2023-02-07

## 2022-10-29 MED ORDER — ONDANSETRON 4 MG PO TBDP: 4.0000 mg | ORAL_TABLET | Freq: Three times a day (TID) | ORAL | 3 refills | Status: DC | PRN

## 2022-10-29 MED ORDER — METHYLPREDNISOLONE ACETATE 80 MG/ML IJ SUSP
40.0000 mg | Freq: Once | INTRAMUSCULAR | Status: DC
Start: 2022-10-29 — End: 2023-02-07

## 2022-10-29 MED ORDER — METHYLPREDNISOLONE 4 MG PO TBPK: ORAL_TABLET | ORAL | 1 refills | Status: AC

## 2022-10-29 MED ORDER — ONDANSETRON 4 MG PO TBDP: 4.0000 mg | ORAL_TABLET | Freq: Three times a day (TID) | ORAL | 11 refills | Status: AC | PRN

## 2022-10-29 NOTE — Progress Notes (Unsigned)
Epworth Sleepiness Scale 0= would never doze 1= slight chance of dozing 2= moderate chance of dozing 3= high chance of dozing  Sitting and reading:3 Watching TV:3 Sitting inactive in a public place (ex. Theater or meeting):1 As a passenger in a car for an hour without a break:3 Lying down to rest in the afternoon:3 Sitting and talking to someone:0 Sitting quietly after lunch (no alcohol):1 In a car, while stopped in traffic:1 Total:15  FSS total: 32

## 2022-10-29 NOTE — Progress Notes (Unsigned)
[facility]    Provider:  Dr Lucia Gaskins Requesting Provider: Monia Pouch., MD Primary Care Provider:  Stevphen Rochester, MD  CC:  migraines  HPI:  Stacy Ochoa is a 47 y.o. female here as requested by Monia Pouch., MD for migraines. has Migraines; Mild intermittent asthma with acute exacerbation; IDA (iron deficiency anemia); Vitamin D deficiency; Acute respiratory failure (HCC); COVID-19 virus infection; and Skene's gland abscess on their problem list.  She has had headaches for years, they have improved on Qulipta, she has no aura, they are pulsating pounding throbbing behind the eyes sometimes in the neck, on Qulipta as she has 4-5 migraine days a month, she has photophobia, phonophobia, nausea, she does vomit, no other associated symptoms and the migraines can last 4 to 72 hours and be moderate to severe.  Prior to [place] she was having 12 migraine days a month that could last all day long and daily headache now have 5 migraine days a month and <10 total headache days a month. She is very happy with the qulipta, she has been on it for 2 years, has had great improvement, the residual are milder and easier to treat and her rescue medication is nurtec and relpax. FHx of migraines.  She has not had brain imaging, migraines haven't changed in quality, not positional or exertional, no vision changes. In the last few weeks getting pain from neck to the right side of the head, probably occipital neuralgia. Soreness inbetween. Morning headaches, daytime fatigue, snoring.sleep evaluation. No other focal neurologic deficits, associated symptoms, inciting events or modifiable factors.   Reviewed notes, labs and imaging from outside physicians, which showed:   Reviewed Dr. Bobbye Charleston note, meds tried > 2 months include:  Per headache clinic note: Current and past medications ANALGESICS: tylenol #3, tylenol, ASA (anaphylaxis) ANTI-MIGRAINE: Imitrex (moderate relief), Relpax  (significant relief), Nurtec (helpful), Ubrelvy (not helpful) HEART/BP: atenolol DECONGESTANT/ANTIHISTAMINE: zyrtec, flonase, singulair, nasonex, allegra, benadryl, claritin, sudafed ANTI-NAUSEANT: zofran 4mg , reglan, phenergan, trimethobenzamide NSAIDS:  MUSCLE RELAXANTS: baclofen (very helpful), flexeril (helpful, but sedating) ANTI-CONVULSANTS: topamax (cog dys at doses higher than 100mg  QD), Trokendi XR STEROIDS: decadron, hydrocortisone, medrol, prednisone SLEEPING PILLS/TRANQUILIZERS: ANTI-DEPRESSANTS: amitriptyline HERBAL:  FIBROMYALGIA:  HORMONAL: OTHER: Emgality (helpful), Aimovig, Ajovy ($$$), Nurtec QOD, Qulipta (helpful) PROCEDURES FOR HEADACHES:   Review of Systems: Patient complains of symptoms per HPI as well as the following symptoms ***. Pertinent negatives and positives per HPI. All others negative.   Social History   Socioeconomic History   Marital status: Married    Spouse name: Not on file   Number of children: Not on file   Years of education: Not on file   Highest education level: Not on file  Occupational History   Not on file  Tobacco Use   Smoking status: Never   Smokeless tobacco: Never  Vaping Use   Vaping Use: Never used  Substance and Sexual Activity   Alcohol use: No   Drug use: No   Sexual activity: Yes    Birth control/protection: Surgical    Comment: vasectomy   Other Topics Concern   Not on file  Social History Narrative   Not on file   Social Determinants of Health   Financial Resource Strain: Not on file  Food Insecurity: Not on file  Transportation Needs: Not on file  Physical Activity: Not on file  Stress: Not on file  Social Connections: Not on file  Intimate Partner Violence: Not on file    Family History  Problem Relation Age of Onset   Hypertension Mother    Diabetes Mother    Heart disease Maternal Grandfather    Osteoporosis Maternal Aunt    Colon polyps Brother 63       precancerous polyps    Past Medical  History:  Diagnosis Date   Asthma    History of anemia 2019   due to heavy vag bleeding    Migraine with aura    gets ~ 1-2 a month - controlled with medication    PONV (postoperative nausea and vomiting)     Patient Active Problem List   Diagnosis Date Noted   Skene's gland abscess 09/06/2022   COVID-19 virus infection 12/21/2021   Acute respiratory failure (HCC) 12/20/2021   Vitamin D deficiency 06/24/2018   IDA (iron deficiency anemia) 09/18/2017   Migraines 09/05/2017   Mild intermittent asthma with acute exacerbation 06/23/2015    Past Surgical History:  Procedure Laterality Date   BREAST REDUCTION SURGERY  1994   CYSTOSCOPY N/A 12/08/2017   Procedure: CYSTOSCOPY;  Surgeon: Jerene Bears, MD;  Location: [facility];  Service: Gynecology;  Laterality: N/A;   TOTAL LAPAROSCOPIC HYSTERECTOMY WITH SALPINGECTOMY Bilateral 12/08/2017   Procedure: TOTAL LAPAROSCOPIC HYSTERECTOMY WITH SALPINGECTOMY;  Surgeon: Jerene Bears, MD;  Location: [facility];  Service: Gynecology;  Laterality: Bilateral;  specimen weight 233 g    Current Outpatient Medications  Medication Sig Dispense Refill   albuterol (VENTOLIN HFA) 108 (90 Base) MCG/ACT inhaler Inhale 1-2 puffs into the lungs every 6 (six) hours as needed for wheezing or shortness of breath. (Patient taking differently: Inhale 1-2 puffs into the lungs as needed for wheezing or shortness of breath.) 1 each 0   Ascorbic Acid (VITAMIN C PO) Take by mouth.     Cholecalciferol (VITAMIN D-3 PO) Take 1 capsule by mouth daily.     Cyanocobalamin (B-12 PO) Take by mouth.     fluticasone (FLONASE) 50 MCG/ACT nasal spray Place 2 sprays into both nostrils 2 (two) times daily.      ipratropium-albuterol (DUONEB) 0.5-2.5 (3) MG/3ML SOLN Take 3 mLs by nebulization every 4 (four) hours as needed for up to 20 days. (Patient taking differently: Take 3 mLs by nebulization as needed.) 360 mL 0   NURTEC 75 MG TBDP Take by  mouth.     QULIPTA 60 MG TABS Take 1 tablet by mouth daily.     No current facility-administered medications for this visit.    Allergies as of 10/29/2022 - Review Complete 10/29/2022  Allergen Reaction Noted   Aspirin Anaphylaxis 12/29/2013   Nsaids Anaphylaxis 04/12/2021   Shellfish allergy Anaphylaxis 11/10/2017    Vitals: BP (!) 141/97   Pulse 70   Ht 5\' 6"  (1.676 m)   Wt 196 lb (88.9 kg)   LMP 11/06/2017 (Exact Date)   BMI 31.64 kg/m  Last Weight:  Wt Readings from Last 1 Encounters:  10/29/22 196 lb (88.9 kg)   Last Height:   Ht Readings from Last 1 Encounters:  10/29/22 5\' 6"  (1.676 m)     Physical exam: Exam: Gen: NAD, conversant, well nourised, obese, well groomed                     CV: RRR, no MRG. No Carotid Bruits. No peripheral edema, warm, nontender Eyes: Conjunctivae clear without exudates or hemorrhage  Neuro: Detailed Neurologic Exam  Speech:    Speech is normal; fluent and spontaneous with normal comprehension.  Cognition:    The patient is oriented to person, place, and time;     recent and remote memory intact;     language fluent;     normal attention, concentration,     fund of knowledge Cranial Nerves:    The pupils are equal, round, and reactive to light. The fundi are normal and spontaneous venous pulsations are present. Visual fields are full to finger confrontation. Extraocular movements are intact. Trigeminal sensation is intact and the muscles of mastication are normal. The face is symmetric. The palate elevates in the midline. Hearing intact. Voice is normal. Shoulder shrug is normal. The tongue has normal motion without fasciculations.   Coordination:    Normal finger to nose and heel to shin. Normal rapid alternating movements.   Gait:    Heel-toe and tandem gait are normal.   Motor Observation:    No asymmetry, no atrophy, and no involuntary movements noted. Tone:    Normal muscle tone.    Posture:    Posture is normal.  normal erect    Strength:    Strength is V/V in the upper and lower limbs.      Sensation: intact to LT     Reflex Exam:  DTR's:    Deep tendon reflexes in the upper and lower extremities are normal bilaterally.   Toes:    The toes are downgoing bilaterally.   Clonus:    Clonus is absent.    Assessment/Plan:    Continue qulipta for prevention Acute: Nurtec and the Relpax. Can also add Ondansetron to this. Take them separately or all together Morning headaches, daytime fatigue, snoring.sleep evaluation  No orders of the defined types were placed in this encounter.  No orders of the defined types were placed in this encounter.   Cc: Monia Pouch., MD,  Stevphen Rochester, MD  Naomie Dean, MD  [facility] [address] [place], [place] [address]  Phone [phone] Fax [phone]

## 2022-10-29 NOTE — Patient Instructions (Addendum)
Continue qulipta for prevention Acute: Nurtec and the Relpax. Can also add 4mg  Ondansetron to this. Take them separately or all together. Can repeat ondansetron 4mg  and relpax in 2 hours if head still there.  Nausea: Ondansetron alone Morning headaches, daytime fatigue, snoring.sleep evaluation ESS 15 Flexeril at bedtime Medrol dosepak start tomorrow if needed  Meds ordered this encounter  Medications   DISCONTD: ondansetron (ZOFRAN-ODT) 4 MG disintegrating tablet    Sig: Take 1-2 tablets (4-8 mg total) by mouth every 8 (eight) hours as needed.    Dispense:  30 tablet    Refill:  3   ondansetron (ZOFRAN-ODT) 4 MG disintegrating tablet    Sig: Take 1-2 tablets (4-8 mg total) by mouth every 8 (eight) hours as needed.    Dispense:  30 tablet    Refill:  11   methylPREDNISolone (MEDROL DOSEPAK) 4 MG TBPK tablet    Sig: Take pills daily all together with food. Take the first dose (6 pills) as soon as possible. Take the rest each morning. For 6 days total 6-5-4-3-2-1.    Dispense:  21 tablet    Refill:  1   cyclobenzaprine (FLEXERIL) 5 MG tablet    Sig: Take 1-2 tablets (5-10 mg total) by mouth every 8 (eight) hours as needed for muscle spasms.    Dispense:  60 tablet    Refill:  6    Orders Placed This Encounter  Procedures   Ambulatory referral to Sleep Studies         Occipital Neuralgia  Occipital neuralgia is a type of headache that causes brief episodes of very bad pain in the back of the head. Pain from occipital neuralgia may spread (radiate) to other parts of the head. These headaches may be caused by irritation of the nerves that leave the spinal cord high up in the neck, just below the base of the skull (occipital nerves). The occipital nerves transmit sensations from the back of the head, the top of the head, and the areas behind the ears. What are the causes? This condition can occur without any known cause (primary headache syndrome). In other cases, this condition is  caused by pressure on or irritation of one of the two occipital nerves. Pressure and irritation may be due to: Muscle spasm in the neck. Neck injury. Wear and tear of the vertebrae in the neck (osteoarthritis). Disease of the disks that separate the vertebrae. Swollen blood vessels that put pressure on the occipital nerves. Infections. Tumors. Diabetes. What are the signs or symptoms? This condition causes brief burning, stabbing, electric, shocking, or shooting pain in the back of the head that can radiate to the top of the head. It can happen on one side or both sides of the head. It can also cause: Pain behind the eye. Pain triggered by neck movement or hair brushing. Scalp tenderness. Aching in the back of the head between episodes of very bad pain. Pain that gets worse with exposure to bright lights. How is this diagnosed? Your health care provider may diagnose the condition based on a physical exam and your symptoms. Tests may be done, such as: Imaging studies of the brain and neck (cervical spine), such as an MRI or CT scan. These look for causes of pinched nerves. Applying pressure to the nerves in the neck to try to re-create the pain. Injection of numbing medicine into the occipital nerve areas to see if pain goes away (diagnostic nerve block). How is this treated? Treatment for this  condition may begin with simple measures, such as: Rest. Massage. Applying heat or cold to the area. Over-the-counter pain relievers. If these measures do not work, you may need other treatments, including: Medicines, such as: Prescription-strength anti-inflammatory medicines. Muscle relaxants. Anti-seizure medicines, which can relieve pain. Antidepressants, which can relieve pain. Injected medicines, such as medicines that numb the area (local anesthetic) and steroids. Pulsed radiofrequency ablation. This is when wires are implanted to deliver electrical impulses that block pain signals from  the occipital nerve. Surgery to relieve nerve pressure. Physical therapy. Follow these instructions at home: Managing pain     Avoid any activities that cause pain. Rest when you have an attack of pain. Try gentle massage to relieve pain. Try a different pillow or sleeping position. If directed, apply heat to the affected area as often as told by your health care provider. Use the heat source that your health care provider recommends, such as a moist heat pack or a heating pad. Place a towel between your skin and the heat source. Leave the heat on for 20-30 minutes. Remove the heat if your skin turns bright red. This is especially important if you are unable to feel pain, heat, or cold. You have a greater risk of getting burned. If directed, put ice on the back of your head and neck area. To do this: Put ice in a plastic bag. Place a towel between your skin and the bag. Leave the ice on for 20 minutes, 2-3 times a day. Remove the ice if your skin turns bright red. This is very important. If you cannot feel pain, heat, or cold, you have a greater risk of damage to the area. General instructions Take over-the-counter and prescription medicines only as told by your health care provider. Avoid things that make your symptoms worse, such as bright lights. Try to stay active. Get regular exercise that does not cause pain. Ask your health care provider to suggest safe exercises for you. Work with a physical therapist to learn stretching exercises you can do at home. Practice good posture. Keep all follow-up visits. This is important. Contact a health care provider if: Your medicine is not working. You have new or worsening symptoms. Get help right away if: You have very bad head pain that does not go away. You have a sudden change in vision, balance, or speech. These symptoms may represent a serious problem that is an emergency. Do not wait to see if the symptoms will go away. Get medical  help right away. Call your local emergency services (911 in the [place] Do not drive yourself to the hospital. Summary Occipital neuralgia is a type of headache that causes brief episodes of very bad pain in the back of the head. Pain from occipital neuralgia may spread (radiate) to other parts of the head. Treatment for this condition includes rest, massage, and medicines. This information is not intended to replace advice given to you by your health care provider. Make sure you discuss any questions you have with your health care provider.  Methylprednisolone Tablets What is this medication? METHYLPREDNISOLONE (meth ill pred NISS oh lone) treats many conditions such as asthma, allergic reactions, arthritis, inflammatory bowel diseases, adrenal, and blood or bone marrow disorders. It works by decreasing inflammation, slowing down an overactive immune system, or replacing cortisol normally made in the body. Cortisol is a hormone that plays an important role in how the body responds to stress, illness, and injury. It belongs to a group of medications  called steroids. This medicine may be used for other purposes; ask your health care provider or pharmacist if you have questions. COMMON BRAND NAME(S): Medrol, Medrol Dosepak What should I tell my care team before I take this medication? They need to know if you have any of these conditions: Cushing's syndrome Eye disease, vision problems Diabetes Glaucoma Heart disease High blood pressure Infection especially a viral infection, such as chickenpox, cold sores, or herpes Liver disease Mental health conditions Myasthenia gravis Osteoporosis Recent or upcoming vaccine Seizures Stomach or intestine problems Thyroid disease An unusual or allergic reaction to lactose, methylprednisolone, other medications, foods, dyes, or preservatives Pregnant or trying to get pregnant Breastfeeding How should I use this medication? Take this medication by  mouth with a glass of water. Follow the directions on the prescription label. Take this medication with food. If you are taking this medication once a day, take it in the morning. Do not take it more often than directed. Do not suddenly stop taking your medication because you may develop a severe reaction. Your care team will tell you how much medication to take. If your care team wants you to stop the medication, the dose may be slowly lowered over time to avoid any side effects. Talk to your care team about the use of this medication in children. Special care may be needed. Overdosage: If you think you have taken too much of this medicine contact a poison control center or emergency room at once. NOTE: This medicine is only for you. Do not share this medicine with others. What if I miss a dose? If you miss a dose, take it as soon as you can. If it is almost time for your next dose, talk to your care team. You may need to miss a dose or take an extra dose. Do not take double or extra doses without advice. What may interact with this medication? Do not take this medication with any of the following: Alefacept Echinacea Live virus vaccines Metyrapone Mifepristone This medication may also interact with the following: Amphotericin B Aspirin and aspirin-like medications Certain antibiotics, such as erythromycin, clarithromycin, troleandomycin Certain medications for diabetes Certain medications for fungal infections, such as ketoconazole Certain medications for seizures, such as carbamazepine, phenobarbital, phenytoin Certain medications that treat or prevent blood clots, such as warfarin Cholestyramine Cyclosporine Digoxin Diuretics Estrogen or progestin hormones Isoniazid NSAIDs, medications for pain and inflammation, such as ibuprofen or naproxen Other medications for myasthenia gravis Rifampin Vaccines This list may not describe all possible interactions. Give your health care provider  a list of all the medicines, herbs, non-prescription drugs, or dietary supplements you use. Also tell them if you smoke, drink alcohol, or use illegal drugs. Some items may interact with your medicine. What should I watch for while using this medication? Tell your care team if your symptoms do not start to get better or if they get worse. Do not stop taking except on your care team's advice. You may develop a severe reaction. Your care team will tell you how much medication to take. This medication may increase your risk of getting an infection. Tell your care team if you are around anyone with measles or chickenpox, or if you develop sores or blisters that do not heal properly. This medication may increase blood sugar levels. Ask your care team if changes in diet or medications are needed if you have diabetes. Tell your care team right away if you have any change in your eyesight. Using this medication  for a long time may increase your risk of low bone mass. Talk to your care team about bone health. What side effects may I notice from receiving this medication? Side effects that you should report to your care team as soon as possible: Allergic reactions--skin rash, itching, hives, swelling of the face, lips, tongue, or throat Cushing syndrome--increased fat around the midsection, upper back, neck, or face, pink or purple stretch marks on the skin, thinning, fragile skin that easily bruises, unexpected hair growth High blood sugar (hyperglycemia)--increased thirst or amount of urine, unusual weakness or fatigue, blurry vision Increase in blood pressure Infection--fever, chills, cough, sore throat, wounds that don't heal, pain or trouble when passing urine, general feeling of discomfort or being unwell Low adrenal gland function--nausea, vomiting, loss of appetite, unusual weakness or fatigue, dizziness Mood and behavior changes--anxiety, nervousness, confusion, hallucinations, irritability, hostility,  thoughts of suicide or self-harm, worsening mood, feelings of depression Stomach bleeding--bloody or black, tar-like stools, vomiting blood or brown material that looks like coffee grounds Swelling of the ankles, hands, or feet Side effects that usually do not require medical attention (report to your care team if they continue or are bothersome): Acne General discomfort and fatigue Headache Increase in appetite Nausea Trouble sleeping Weight gain This list may not describe all possible side effects. Call your doctor for medical advice about side effects. You may report side effects to FDA at 1-800-FDA-1088. Where should I keep my medication? Keep out of the reach of children and pets. Store at room temperature between 20 and 25 degrees C (68 and 77 degrees F). Throw away any unused medication after the expiration date. NOTE: This sheet is a summary. It may not cover all possible information. If you have questions about this medicine, talk to your doctor, pharmacist, or health care provider.  2024 Elsevier/Gold Standard (2021-12-05 00:00:00) Cyclobenzaprine Tablets What is this medication? CYCLOBENZAPRINE (sye kloe BEN za preen) treats muscle spasms. It works by relaxing your muscles, which reduces muscle stiffness. It belongs to a group of medications called muscle relaxants. This medicine may be used for other purposes; ask your health care provider or pharmacist if you have questions. COMMON BRAND NAME(S): Fexmid, Flexeril What should I tell my care team before I take this medication? They need to know if you have any of these conditions: Heart disease, irregular heartbeat, or previous heart attack Liver disease Thyroid problem An unusual or allergic reaction to cyclobenzaprine, tricyclic antidepressants, lactose, other medications, foods, dyes, or preservatives Pregnant or trying to get pregnant Breastfeeding How should I use this medication? Take this medication by mouth with a  glass of water. Take it as directed on the prescription label. You can take it with or without food. If it upsets your stomach, take it with food. Do not take it more often than directed. Talk to your care team about the use of this medication in children. Special care may be needed. Overdosage: If you think you have taken too much of this medicine contact a poison control center or emergency room at once. NOTE: This medicine is only for you. Do not share this medicine with others. What if I miss a dose? If you miss a dose, take it as soon as you can. If it is almost time for your next dose, take only that dose. Do not take double or extra doses. What may interact with this medication? Do not take this medication with any of the following: MAOIs, such as Carbex, Eldepryl, Marplan, Nardil, and  Parnate Opioid medications for cough Safinamide This medication may also interact with the following: Alcohol Bupropion Antihistamines for allergy, cough, and cold Certain medications for anxiety or sleep Certain medications for bladder problems, such as oxybutynin, tolterodine Certain medications for depression, such as amitriptyline, fluoxetine, sertraline Certain medications for Parkinson disease, such as benztropine, trihexyphenidyl Certain medications for seizures, such as phenobarbital, primidone Certain medications for stomach problems, such as dicyclomine, hyoscyamine Certain medications for travel sickness, such as scopolamine General anesthetics, such as halothane, isoflurane, methoxyflurane, propofol Ipratropium Local anesthetics, such as lidocaine, pramoxine, tetracaine Medications that relax muscles for surgery Opioid medications for pain Phenothiazines, such as chlorpromazine, mesoridazine, prochlorperazine, thioridazine Verapamil This list may not describe all possible interactions. Give your health care provider a list of all the medicines, herbs, non-prescription drugs, or dietary  supplements you use. Also tell them if you smoke, drink alcohol, or use illegal drugs. Some items may interact with your medicine. What should I watch for while using this medication? Visit your care team for regular checks on your progress. Tell your care team if your symptoms do not start to get better or if they get worse. This medication may affect your coordination, reaction time, or judgment. Do not drive or operate machinery until you know how this medication affects you. Sit up or stand slowly to reduce the risk of dizzy or fainting spells. Drinking alcohol with this medication can increase the risk of these side effects. Taking this medication with other substances that cause drowsiness, such as alcohol, benzodiazepines, or other opioids can cause serious side effects. Give your care team a list of all medications you use. They will tell you how much medication to take. Do not take more medication than directed. Call emergency services if you have problems breathing or staying awake. Your mouth may get dry. Chewing sugarless gum or sucking hard candy and drinking plenty of water may help. Contact your care team if the problem does not go away or is severe. What side effects may I notice from receiving this medication? Side effects that you should report to your care team as soon as possible: Allergic reactions--skin rash, itching, hives, swelling of the face, lips, tongue, or throat CNS depression--slow or shallow breathing, shortness of breath, feeling faint, dizziness, confusion, trouble staying awake Heart rhythm changes--fast or irregular heartbeat, dizziness, feeling faint or lightheaded, chest pain, trouble breathing Side effects that usually do not require medical attention (report to your care team if they continue or are bothersome): Constipation Dizziness Drowsiness Dry mouth Fatigue Nausea This list may not describe all possible side effects. Call your doctor for medical advice  about side effects. You may report side effects to FDA at 1-800-FDA-1088. Where should I keep my medication? Keep out of the reach of children and pets. Store at room temperature between 20 and 25 degrees C (68 and 77 degrees F). Keep container tightly closed. Get rid of any unused medication after the expiration date. To get rid of medications that are no longer needed or have expired: Take the medication to a medication take-back program. Check with your pharmacy or law enforcement to find a location. If you cannot return the medication, check the label or package insert to see if the medication should be thrown out in the garbage or flushed down the toilet. If you are not sure, ask your care team. If it is safe to put it in the trash, empty the medication out of the container. Mix the medication with cat litter,  dirt, coffee grounds, or other unwanted substance. Seal the mixture in a bag or container. Put it in the trash. NOTE: This sheet is a summary. It may not cover all possible information. If you have questions about this medicine, talk to your doctor, pharmacist, or health care provider.  2024 Elsevier/Gold Standard (2021-10-12 00:00:00)  Document Revised: 02/06/2020 Document Reviewed: 02/06/2020 Elsevier Patient Education  2024 [employer].

## 2022-10-29 NOTE — Progress Notes (Unsigned)
Nerve block with steroid: Methylprednisolone. 40mg  was used, 40mg  was wasted.  Pt signed consent  0.5% Bupivocaine 3 mL LOT: [id number] EXP: 11/2024 NDC: 54098-119-14  2% Lidocaine 3 mL LOT: [id number] EXP: 10/2024 NDC: 30865-784-69  Witnessed by Toma Copier

## 2022-10-30 ENCOUNTER — Encounter: Payer: Self-pay | Admitting: Neurology

## 2022-11-20 ENCOUNTER — Ambulatory Visit: Payer: 59 | Admitting: Obstetrics and Gynecology

## 2022-11-20 ENCOUNTER — Encounter: Payer: Self-pay | Admitting: Obstetrics and Gynecology

## 2022-11-20 VITALS — BP 124/90 | HR 72 | Ht 65.25 in | Wt 194.0 lb

## 2022-11-20 DIAGNOSIS — N340 Urethral abscess: Secondary | ICD-10-CM | POA: Diagnosis not present

## 2022-11-20 DIAGNOSIS — R350 Frequency of micturition: Secondary | ICD-10-CM

## 2022-11-20 LAB — POCT URINALYSIS DIPSTICK
Bilirubin, UA: NEGATIVE
Blood, UA: NEGATIVE
Glucose, UA: NEGATIVE
Ketones, UA: NEGATIVE
Leukocytes, UA: NEGATIVE
Nitrite, UA: NEGATIVE
Protein, UA: NEGATIVE
Spec Grav, UA: >=1.03 — AB (ref 1.010–1.025)
Urobilinogen, UA: 0.2 E.U./dL
pH, UA: 5.5 (ref 5.0–8.0)

## 2022-11-20 NOTE — Progress Notes (Signed)
Urogynecology New Patient Evaluation and Consultation  Referring Provider: Jerene Bears, MD PCP: Stevphen Rochester, MD Date of Service: 11/20/2022  SUBJECTIVE Chief Complaint: New Patient (Initial Visit) Stacy Ochoa is a 47 y.o. female here for a consult for skene' gland abscess. Pt said she has kidney pain.)  History of Present Illness: Stacy Ochoa is a 47 y.o. White or Caucasian female seen in consultation at the request of Dr. 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    Review of records significant for: Seen by Urology un [facility] and had cystoscopy. Has occasional drainage of cyst, feels larger on right side of urethra.   MRI pelvis (07/04/22) shows:  IMPRESSION: 1. Unremarkable MR examination of the pelvis. No findings for urethral diverticulum. 2. Status post hysterectomy. Both ovaries are still present and are normal.  Pt reports: Noted pus next to the urethra about a year ago. Has had intermittent UTI symptoms. Incontinence and urinary frequency increased but was told she did not have an infection- would get relief with antibiotics. Culture as done in May- Showed Proteus. Currently, incontinence and urinary frequency has improved. Has a lot of pain in her low back, unsure if it is her kidneys.   Seen by Urology in [place] and had cystoscopy and MRI- Dr Othelia Pulling at [facility].   Urinary Symptoms: Leaks urine with cough/ sneeze, laughing, exercise, during sex, and with urgency Does not leak every day.  Pad use: 1-5 liners/ mini-pads per day.   She is bothered by her UI symptoms.  Day time voids 3.  Nocturia: 1-3 times per night to void. Voiding dysfunction: she empties her bladder well.  does not use a catheter to empty bladder.  When urinating, she feels dribbling after finishing  UTIs: 4 UTI's in the last year.   Reports history of blood in urine  Pelvic Organ Prolapse Symptoms:                  She Denies a feeling of a bulge the  vaginal area.   Bowel Symptom: Bowel movements: 1 time(s) per day Stool consistency: hard Straining: yes.  Splinting: yes.  Incomplete evacuation: yes.  She Denies accidental bowel leakage / fecal incontinence Bowel regimen: none   Sexual Function Sexually active: yes.  Pain with sex: Yes, deep in the pelvis, and when abscess is inflammed  Pelvic Pain Admits to pelvic pain Location: "under belly button" Pain occurs: every few weeks Prior pain treatment: n/a Improved by: heating pad Worsened by: pressure from clothing   Past Medical History:  Past Medical History:  Diagnosis Date   Asthma    History of anemia 2019   due to heavy vag bleeding    Migraine with aura    gets ~ 1-2 a month - controlled with medication    PONV (postoperative nausea and vomiting)      Past Surgical History:   Past Surgical History:  Procedure Laterality Date   BREAST REDUCTION SURGERY  1994   CYSTOSCOPY N/A 12/08/2017   Procedure: CYSTOSCOPY;  Surgeon: Jerene Bears, MD;  Location: [facility];  Service: Gynecology;  Laterality: N/A;   TOTAL LAPAROSCOPIC HYSTERECTOMY WITH SALPINGECTOMY Bilateral 12/08/2017   Procedure: TOTAL LAPAROSCOPIC HYSTERECTOMY WITH SALPINGECTOMY;  Surgeon: Jerene Bears, MD;  Location: [facility];  Service: Gynecology;  Laterality: Bilateral;  specimen weight 233 g     Past OB/GYN History: OB History  Gravida Para Term Preterm AB Living  2 2 2     2   SAB IAB Ectopic Multiple Live Births          2    # Outcome Date GA Lbr Len/2nd Weight Sex Type Anes PTL Lv  2 Term 04/19/02    M Vag-Spont   LIV  1 Term 01/07/00    M Vag-Spont   LIV    S/p hsyterectomy   Medications: She has a current medication list which includes the following prescription(s): albuterol, ascorbic acid, cholecalciferol, cyanocobalamin, cyclobenzaprine, fluticasone, ipratropium-albuterol, methylprednisolone, nurtec, ondansetron, and qulipta, and the following  Facility-Administered Medications: bupivacaine, lidocaine, and methylprednisolone acetate.   Allergies: Patient is allergic to aspirin, nsaids, and shellfish allergy.   Social History:  Social History   Tobacco Use   Smoking status: Never   Smokeless tobacco: Never  Vaping Use   Vaping status: Never Used  Substance Use Topics   Alcohol use: No   Drug use: No    Relationship status: married She lives with husband.   She is employed as a Web designer. Regular exercise: No History of abuse: No  Family History:   Family History  Problem Relation Age of Onset   Hypertension Mother    Diabetes Mother    Heart disease Maternal Grandfather    Osteoporosis Maternal Aunt    Colon polyps Brother 36       precancerous polyps     Review of Systems: Review of Systems  Constitutional:  Positive for malaise/fatigue. Negative for fever and weight loss.  Respiratory:  Negative for cough, shortness of breath and wheezing.   Cardiovascular:  Negative for chest pain, palpitations and leg swelling.  Gastrointestinal:  Negative for abdominal pain and blood in stool.  Genitourinary:  Negative for dysuria.  Musculoskeletal:  Positive for myalgias.  Skin:  Negative for rash.  Neurological:  Positive for dizziness and headaches.  Endo/Heme/Allergies:  Bruises/bleeds easily.  Psychiatric/Behavioral:  Negative for depression. The patient is not nervous/anxious.      OBJECTIVE Physical Exam: Vitals:   11/20/22 0821 11/20/22 0903  BP: (!) 150/90 (!) 124/90  Pulse: 83 72  Weight: 194 lb (88 kg)   Height: 5' 5.25" (1.657 m)     Physical Exam Constitutional:      General: She is not in acute distress. Pulmonary:     Effort: Pulmonary effort is normal.  Abdominal:     General: There is no distension.     Palpations: Abdomen is soft.     Tenderness: There is no abdominal tenderness. There is no rebound.  Musculoskeletal:        General: No swelling. Normal range of motion.  Skin:     General: Skin is warm and dry.     Findings: No rash.  Neurological:     Mental Status: She is alert and oriented to person, place, and time.  Psychiatric:        Mood and Affect: Mood normal.        Behavior: Behavior normal.      GU / Detailed Urogynecologic Evaluation:  Pelvic Exam: Normal external female genitalia; Bartholin's and Skene's glands normal in appearance- no pus or swelling appreciated in the skene's glands; urethral meatus normal in appearance, no urethral masses or discharge.   CST: negative  s/p hysterectomy: Speculum exam reveals normal vaginal mucosa without  atrophy and normal vaginal cuff.  Adnexa no mass, fullness, tenderness.    Pelvic floor strength II/V  Pelvic floor musculature: Right levator non-tender, Right obturator non-tender,  Left levator non-tender, Left obturator non-tender  POP-Q:   POP-Q  -2.5                                            Aa   -2.5                                           Ba  -8                                              C   3.5                                            Gh  4                                            Pb  9                                            tvl   -2.5                                            Ap  -2.5                                            Bp                                                 D      Rectal Exam:  Normal external rectum  Post-Void Residual (PVR) by Bladder Scan: In order to evaluate bladder emptying, we discussed obtaining a postvoid residual and she agreed to this procedure.  Procedure: The ultrasound unit was placed on the patient's abdomen in the suprapubic region after the patient had voided. A PVR of 43 ml was obtained by bladder scan.  Laboratory Results: POC urine: negative   ASSESSMENT AND PLAN Ms. Weinreich is a 47 y.o. with:  1. Skene's gland abscess   2. Urinary frequency    - Prior skene's gland abscess- pus drainage and swelling present  however today she is not having symptoms and no drainage or swelling is present.  - We discussed that it would be difficult to identify any cyst surgically unless it was increased in size. No definitive cyst seen on MRI. Therefore she will need to wait until she is symptomatic and return for an exam. We briefly discussed surgery for  skene's gland cyst removal including possible injury to the urethra and need for prolonged catheter.  - cystoscopy was previously done at Urology office- will attempt to obtain records if we need to proceed for surgery.   Plan to return when having symptoms   Marguerita Beards, MD

## 2022-12-04 ENCOUNTER — Other Ambulatory Visit (HOSPITAL_BASED_OUTPATIENT_CLINIC_OR_DEPARTMENT_OTHER): Payer: Self-pay | Admitting: Family Medicine

## 2022-12-04 DIAGNOSIS — R109 Unspecified abdominal pain: Secondary | ICD-10-CM

## 2022-12-08 ENCOUNTER — Ambulatory Visit (HOSPITAL_BASED_OUTPATIENT_CLINIC_OR_DEPARTMENT_OTHER)
Admission: RE | Admit: 2022-12-08 | Discharge: 2022-12-08 | Disposition: A | Discharge status: Home / Self Care | Payer: 59 | Source: Ambulatory Visit | Attending: Family Medicine | Admitting: Family Medicine

## 2022-12-08 DIAGNOSIS — R10A0 Flank pain, unspecified side: Secondary | ICD-10-CM

## 2022-12-08 DIAGNOSIS — R109 Unspecified abdominal pain: Secondary | ICD-10-CM | POA: Diagnosis not present

## 2023-01-09 ENCOUNTER — Other Ambulatory Visit: Payer: Self-pay | Admitting: Neurology

## 2023-01-13 ENCOUNTER — Telehealth: Payer: Self-pay

## 2023-01-13 NOTE — Telephone Encounter (Signed)
Pharmacy Patient Advocate Encounter   Received notification from CoverMyMeds that prior authorization for Qulipta 60MG  tablets is required/requested.   Insurance verification completed.   The patient is insured through [facility] .   Per test claim: PA required; PA submitted to [facility] via CoverMyMeds Key/confirmation #/EOC [id number] Status is pending

## 2023-01-14 ENCOUNTER — Other Ambulatory Visit: Payer: Self-pay | Admitting: Neurology

## 2023-01-17 NOTE — Telephone Encounter (Signed)
Pharmacy Patient Advocate Encounter  Received notification from [facility] that Prior Authorization for Qulipta 60MG  tablets has been APPROVED from 01/13/2023 to 01/13/2024   PA #/Case ID/Reference #: PA Case ID #: [id number]

## 2023-01-26 ENCOUNTER — Encounter: Payer: Self-pay | Admitting: Obstetrics and Gynecology

## 2023-01-27 NOTE — Telephone Encounter (Signed)
Attempted to contact patient. Stacy Ochoa

## 2023-01-29 ENCOUNTER — Encounter: Payer: Self-pay | Admitting: Obstetrics and Gynecology

## 2023-01-29 ENCOUNTER — Ambulatory Visit: Payer: 59 | Admitting: Obstetrics and Gynecology

## 2023-01-29 VITALS — BP 111/82 | HR 79

## 2023-01-29 DIAGNOSIS — N340 Urethral abscess: Secondary | ICD-10-CM | POA: Diagnosis not present

## 2023-01-29 MED ORDER — CEPHALEXIN 500 MG PO CAPS
500.0000 mg | ORAL_CAPSULE | Freq: Four times a day (QID) | ORAL | 0 refills | Status: DC
Start: 2023-01-29 — End: 2023-02-03

## 2023-01-29 NOTE — Progress Notes (Signed)
[name]  SUBJECTIVE  History of Present Illness: Stacy Ochoa is a 47 y.o. female seen in follow-up for skene's gland abscess.  Noticed on Saturday she has swelling and pus in the vagina. Today, does not look as inflamed and pus has been draining. Denies fevers but has had some chills and feels ill.   Past Medical History: Patient  has a past medical history of Asthma, History of anemia (2019), Migraine with aura, and PONV (postoperative nausea and vomiting).   Past Surgical History: She  has a past surgical history that includes Breast reduction surgery (1994); Total laparoscopic hysterectomy with salpingectomy (Bilateral, 12/08/2017); and Cystoscopy (N/A, 12/08/2017).   Medications: She has a current medication list which includes the following prescription(s): albuterol, ascorbic acid, qulipta, cephalexin, cholecalciferol, cyanocobalamin, cyclobenzaprine, fluticasone, methylprednisolone, nurtec, ondansetron, and ipratropium-albuterol, and the following Facility-Administered Medications: bupivacaine, lidocaine, and methylprednisolone acetate.   Allergies: Patient is allergic to aspirin, nsaids, and shellfish allergy.   Social History: Patient  reports that she has never smoked. She has never used smokeless tobacco. She reports that she does not drink alcohol and does not use drugs.      OBJECTIVE     Physical Exam: Vitals:   01/29/23 0821 01/29/23 0822  BP: (!) 132/91 111/82  Pulse: 84 79   Gen: No apparent distress, A&O x 3.  Detailed Urogynecologic Evaluation:  On exam, normal appearing urethra and skene's gland. No swelling or abscess noted. Unable to obtain any fluid discharge with milking under urethra. Normal mucosa on speculum exam.    ASSESSMENT AND PLAN    [name] is a 47 y.o. with:  1. Skene's gland abscess    - Will give course of keflex for her to take if she has a flare of the abscess again.  - Again we discussed that if there  is a persistent swelling then this can be removed surgically, but if it is transient then would be difficult to remove anything surgically  Return when needed  Marguerita Beards, MD

## 2023-02-03 ENCOUNTER — Encounter (HOSPITAL_BASED_OUTPATIENT_CLINIC_OR_DEPARTMENT_OTHER): Payer: Self-pay | Admitting: Certified Nurse Midwife

## 2023-02-03 ENCOUNTER — Ambulatory Visit (HOSPITAL_BASED_OUTPATIENT_CLINIC_OR_DEPARTMENT_OTHER): Payer: 59 | Admitting: Certified Nurse Midwife

## 2023-02-03 VITALS — BP 141/94 | HR 99 | Ht 65.25 in | Wt 193.0 lb

## 2023-02-03 DIAGNOSIS — N340 Urethral abscess: Secondary | ICD-10-CM | POA: Diagnosis not present

## 2023-02-03 DIAGNOSIS — R109 Unspecified abdominal pain: Secondary | ICD-10-CM | POA: Diagnosis not present

## 2023-02-03 DIAGNOSIS — N390 Urinary tract infection, site not specified: Secondary | ICD-10-CM | POA: Insufficient documentation

## 2023-02-03 DIAGNOSIS — R509 Fever, unspecified: Secondary | ICD-10-CM

## 2023-02-03 MED ORDER — FLUCONAZOLE 150 MG PO TABS: 150.0000 mg | ORAL_TABLET | ORAL | 2 refills | Status: AC | PRN

## 2023-02-03 MED ORDER — CEPHALEXIN 500 MG PO CAPS
500.0000 mg | ORAL_CAPSULE | Freq: Four times a day (QID) | ORAL | 0 refills | Status: AC
Start: 2023-02-03 — End: 2023-02-08

## 2023-02-03 NOTE — Progress Notes (Unsigned)
GYNECOLOGY  VISIT  CC:   Pt here to discuss issues with recurrent UTI and Skene's gland abscesses over the past ~ one year. She is currently being treated for UTI with Keflex QID x 5 days. Pt holding her left flank area and states it continues to hurt. Fever/chills have resolved. She did return to work today but states "I don't feel too well".   HPI: 47 y.o. [id number] Married White or Caucasian female here for problem follow-up gyn visit. She was recently diagnosed with UTI 01/30/23 and started Keflex QID x 5 days. Pt states "I missed four days of work" due to the UTI which had associated fever,chills, and left flank pain. Pt reports that she has experienced 4-6 urinary tract infections since being diagnosed with Skene's Gland abscess. Pt feels that increased size of Skene's gland contributing to recurrent UTI. She is interested in surgical therapy to remove the Skene's Gland if possible. States "I just cannot continue to live like this". Pt states "I have been dealing with this issue of Skene's Gland abscesses and urinary tract infections for about a year now". Does not currently feel like the Skene's gland is infected, but it has continued to drain pus.    Past Medical History:  Diagnosis Date   Asthma    History of anemia 2019   due to heavy vag bleeding    Migraine with aura    gets ~ 1-2 a month - controlled with medication    PONV (postoperative nausea and vomiting)     MEDS:   Current Outpatient Medications on File Prior to Visit  Medication Sig Dispense Refill   albuterol (VENTOLIN HFA) 108 (90 Base) MCG/ACT inhaler Inhale 1-2 puffs into the lungs every 6 (six) hours as needed for wheezing or shortness of breath. (Patient taking differently: Inhale 1-2 puffs into the lungs as needed for wheezing or shortness of breath.) 1 each 0   Ascorbic Acid (VITAMIN C PO) Take by mouth.     Atogepant (QULIPTA) 60 MG TABS TAKE 1 TABLET BY MOUTH DAILY 30 tablet 5   Cholecalciferol (VITAMIN D-3 PO)  Take 1 capsule by mouth daily.     Cyanocobalamin (B-12 PO) Take by mouth.     cyclobenzaprine (FLEXERIL) 5 MG tablet Take 1-2 tablets (5-10 mg total) by mouth every 8 (eight) hours as needed for muscle spasms. 60 tablet 6   fluticasone (FLONASE) 50 MCG/ACT nasal spray Place 2 sprays into both nostrils 2 (two) times daily.      methylPREDNISolone (MEDROL DOSEPAK) 4 MG TBPK tablet Take pills daily all together with food. Take the first dose (6 pills) as soon as possible. Take the rest each morning. For 6 days total 6-5-4-3-2-1. 21 tablet 1   NURTEC 75 MG TBDP TAKE 1 TABLET BY MOUTH DAILY AS NEEDED 8 tablet 8   ondansetron (ZOFRAN-ODT) 4 MG disintegrating tablet Take 1-2 tablets (4-8 mg total) by mouth every 8 (eight) hours as needed. 30 tablet 11   ipratropium-albuterol (DUONEB) 0.5-2.5 (3) MG/3ML SOLN Take 3 mLs by nebulization every 4 (four) hours as needed for up to 20 days. (Patient taking differently: Take 3 mLs by nebulization as needed.) 360 mL 0   Current Facility-Administered Medications on File Prior to Visit  Medication Dose Route Frequency Provider Last Rate Last Admin   bupivacaine (MARCAINE) 0.5 % (with pres) injection 3 mL  3 mL Infiltration Once Anson Fret, MD       lidocaine (XYLOCAINE) 2 % (with pres) injection 60  mg  3 mL Intradermal Once Anson Fret, MD       methylPREDNISolone acetate (DEPO-MEDROL) injection 40 mg  40 mg Intramuscular Once Anson Fret, MD        ALLERGIES: Aspirin, Nsaids, and Shellfish allergy  ROS  PHYSICAL EXAMINATION:    BP (!) 141/94 (BP Location: Left Arm, Patient Position: Sitting, Cuff Size: Normal)   Pulse 99   Ht 5' 5.25" (1.657 m)   Wt 193 lb (87.5 kg)   LMP 11/06/2017 (Exact Date)   BMI 31.87 kg/m     General appearance: alert, cooperative and appears stated age  Pelvic: External genitalia:  no lesions              Urethra:  normal appearing urethra with no masses, tenderness or lesions              Bartholins and  Skenes: Right Skene's gland enlarged, slightly tender, draining small amount white pus without noticeable odor.   Chaperone, Dr. Leda Quail, MD, was present for exam.  Assessment/Plan: 1. Skene's gland abscess -Dr. Hyacinth Meeker in to evaluate and help formulate plan of care. Will extend antibiotics another 5 days (pt remains symptomatic of UTI) - cephALEXin (KEFLEX) 500 MG capsule; Take 1 capsule (500 mg total) by mouth 4 (four) times daily for 5 days.  Dispense: 20 capsule; Refill: 0 - Comp Met (CMET) - CBC w/Diff  2. Left flank pain -On antibiotic therapy, extended x 5 days  3. Fever, resolved - Comp Met (CMET) - CBC w/Diff  4. Recurrent UTI (r/t Skene's Gland enlargement) -On antibiotics currently  Dr. Hyacinth Meeker will communicate with Dr. Lanetta Inch and formulate plan of care (follow-up).  Notify patient once plan of care formulated.   Letta Kocher

## 2023-02-04 ENCOUNTER — Encounter (HOSPITAL_BASED_OUTPATIENT_CLINIC_OR_DEPARTMENT_OTHER): Payer: Self-pay | Admitting: Certified Nurse Midwife

## 2023-02-04 LAB — COMPREHENSIVE METABOLIC PANEL
ALT: 21 [IU]/L (ref 0–32)
AST: 17 [IU]/L (ref 0–40)
Albumin: 4.5 g/dL (ref 3.9–4.9)
Alkaline Phosphatase: 117 [IU]/L (ref 44–121)
BUN/Creatinine Ratio: 20 (ref 9–23)
BUN: 14 mg/dL (ref 6–24)
Bilirubin Total: 0.3 mg/dL (ref 0.0–1.2)
CO2: 24 mmol/L (ref 20–29)
Calcium: 9.8 mg/dL (ref 8.7–10.2)
Chloride: 104 mmol/L (ref 96–106)
Creatinine, Ser: 0.71 mg/dL (ref 0.57–1.00)
Globulin, Total: 2.7 g/dL (ref 1.5–4.5)
Glucose: 112 mg/dL — ABNORMAL HIGH (ref 70–99)
Potassium: 3.8 mmol/L (ref 3.5–5.2)
Sodium: 142 mmol/L (ref 134–144)
Total Protein: 7.2 g/dL (ref 6.0–8.5)
eGFR: 105 mL/min/{1.73_m2} (ref >59)

## 2023-02-04 LAB — CBC WITH DIFFERENTIAL/PLATELET
Basophils Absolute: 0.1 10*3/uL (ref 0.0–0.2)
Basos: 1 %
EOS (ABSOLUTE): 0 10*3/uL (ref 0.0–0.4)
Eos: 1 %
Hematocrit: 44.1 % (ref 34.0–46.6)
Hemoglobin: 14.1 g/dL (ref 11.1–15.9)
Immature Grans (Abs): 0.1 10*3/uL (ref 0.0–0.1)
Immature Granulocytes: 1 %
Lymphocytes Absolute: 1.9 10*3/uL (ref 0.7–3.1)
Lymphs: 22 %
MCH: 29.4 pg (ref 26.6–33.0)
MCHC: 32 g/dL (ref 31.5–35.7)
MCV: 92 fL (ref 79–97)
Monocytes Absolute: 0.6 10*3/uL (ref 0.1–0.9)
Monocytes: 7 %
Neutrophils Absolute: 5.8 10*3/uL (ref 1.4–7.0)
Neutrophils: 68 %
Platelets: 316 10*3/uL (ref 150–450)
RBC: 4.8 x10E6/uL (ref 3.77–5.28)
RDW: 12.2 % (ref 11.7–15.4)
WBC: 8.5 10*3/uL (ref 3.4–10.8)

## 2023-02-06 ENCOUNTER — Encounter (HOSPITAL_BASED_OUTPATIENT_CLINIC_OR_DEPARTMENT_OTHER): Payer: Self-pay | Admitting: *Deleted

## 2023-02-06 ENCOUNTER — Ambulatory Visit: Payer: 59 | Admitting: Obstetrics and Gynecology

## 2023-02-06 ENCOUNTER — Encounter: Payer: Self-pay | Admitting: Obstetrics and Gynecology

## 2023-02-06 ENCOUNTER — Telehealth: Payer: Self-pay | Admitting: Obstetrics and Gynecology

## 2023-02-06 ENCOUNTER — Encounter (HOSPITAL_BASED_OUTPATIENT_CLINIC_OR_DEPARTMENT_OTHER): Payer: Self-pay | Admitting: Obstetrics and Gynecology

## 2023-02-06 VITALS — BP 146/99 | HR 76

## 2023-02-06 DIAGNOSIS — N340 Urethral abscess: Secondary | ICD-10-CM

## 2023-02-06 NOTE — Progress Notes (Signed)
[facility] Return Visit  SUBJECTIVE  History of Present Illness: Stacy Ochoa is a 47 y.o. female seen in follow-up for skene's gland abscess.  Earlier this week was seen in GYN office since she had recurrent symptoms of discharge from area of skene's gland. Keflex Rx was extended a few more days. Was feeling better but yesterday and today has seen some blood discharge.    Past Medical History: Patient  has a past medical history of Asthma, History of anemia (2019), Migraine with aura, and PONV (postoperative nausea and vomiting).   Past Surgical History: She  has a past surgical history that includes Breast reduction surgery (1994); Total laparoscopic hysterectomy with salpingectomy (Bilateral, 12/08/2017); and Cystoscopy (N/A, 12/08/2017).   Medications: She has a current medication list which includes the following prescription(s): albuterol, ascorbic acid, qulipta, cephalexin, cholecalciferol, cyanocobalamin, cyclobenzaprine, fluconazole, fluticasone, methylprednisolone, nurtec, ondansetron, and ipratropium-albuterol, and the following Facility-Administered Medications: bupivacaine, lidocaine, and methylprednisolone acetate.   Allergies: Patient is allergic to aspirin, nsaids, and shellfish allergy.   Social History: Patient  reports that she has never smoked. She has never used smokeless tobacco. She reports that she does not drink alcohol and does not use drugs.      OBJECTIVE     Physical Exam: Vitals:   02/06/23 1438  BP: (!) 146/99  Pulse: 76    Gen: No apparent distress, A&O x 3. CV: S1 S2 RRR Lungs: clear to auscultation bilaterally Pelvic: On exam, swelling noted to the right of the urethra with blood discharge expelled from the skenes gland orifice. Some swelling noted vaginally.    ASSESSMENT AND PLAN    Stacy Ochoa is a 47 y.o. with:  1. Skene's gland abscess     - Continue Keflex - Will plan to add on for excision of Skene's gland cyst on  Monday - We reviewed the patient's specific anatomic and functional findings.  We reviewed the benefits and risks of each treatment option. We discussed risks of bleeding, infection, damage to surrounding organs including bowel, bladder, blood vessels, ureters and nerves, need for further surgery, numbness and weakness at any body site, and the rarer risks of blood clot, heart attack, pneumonia, death. All her questions were answered and she verbalized understanding.     Marguerita Beards, MD

## 2023-02-06 NOTE — Telephone Encounter (Signed)
Received message that patient was seen in GYN clinic for active skene's gland infection. I called patient so that she can be seen by me today or tomorrow so we can plan excision. No answer but voicemail left to return call to office.   Marguerita Beards, MD

## 2023-02-06 NOTE — H&P (Signed)
[facility] Pre-Operative H&P  Subjective Chief Complaint: Stacy Ochoa presents for a preoperative encounter.   History of Present Illness: Stacy Ochoa is a 47 y.o. female who presents for preoperative visit.  She is scheduled to undergo Excision of skene's gland cyst and cystoscopy on 02/10/23.  Has had recurrent skene's gland abscess. MRI negative for urethral diverticulum. Currently has a urethral swelling on the right with bloody drainage.   Past Medical History:  Diagnosis Date   Asthma    History of anemia 2019   due to heavy vag bleeding    Migraine with aura    gets ~ 1-2 a month - controlled with medication    PONV (postoperative nausea and vomiting)      Past Surgical History:  Procedure Laterality Date   BREAST REDUCTION SURGERY  1994   CYSTOSCOPY N/A 12/08/2017   Procedure: CYSTOSCOPY;  Surgeon: Jerene Bears, MD;  Location: [facility];  Service: Gynecology;  Laterality: N/A;   TOTAL LAPAROSCOPIC HYSTERECTOMY WITH SALPINGECTOMY Bilateral 12/08/2017   Procedure: TOTAL LAPAROSCOPIC HYSTERECTOMY WITH SALPINGECTOMY;  Surgeon: Jerene Bears, MD;  Location: [facility];  Service: Gynecology;  Laterality: Bilateral;  specimen weight 233 g    is allergic to aspirin, nsaids, and shellfish allergy.   Family History  Problem Relation Age of Onset   Hypertension Mother    Diabetes Mother    Heart disease Maternal Grandfather    Osteoporosis Maternal Aunt    Colon polyps Brother 75       precancerous polyps    Social History   Tobacco Use   Smoking status: Never   Smokeless tobacco: Never  Vaping Use   Vaping status: Never Used  Substance Use Topics   Alcohol use: No   Drug use: No     Review of Systems was negative for a full 10 system review except as noted in the History of Present Illness.   Current Facility-Administered Medications:    bupivacaine (MARCAINE) 0.5 % (with pres) injection 3 mL, 3 mL,  Infiltration, Once, Naomie Dean B, MD   lidocaine (XYLOCAINE) 2 % (with pres) injection 60 mg, 3 mL, Intradermal, Once, Naomie Dean B, MD   methylPREDNISolone acetate (DEPO-MEDROL) injection 40 mg, 40 mg, Intramuscular, Once, Anson Fret, MD  Current Outpatient Medications:    albuterol (VENTOLIN HFA) 108 (90 Base) MCG/ACT inhaler, Inhale 1-2 puffs into the lungs every 6 (six) hours as needed for wheezing or shortness of breath. (Patient taking differently: Inhale 1-2 puffs into the lungs as needed for wheezing or shortness of breath.), Disp: 1 each, Rfl: 0   Ascorbic Acid (VITAMIN C PO), Take by mouth., Disp: , Rfl:    Atogepant (QULIPTA) 60 MG TABS, TAKE 1 TABLET BY MOUTH DAILY, Disp: 30 tablet, Rfl: 5   cephALEXin (KEFLEX) 500 MG capsule, Take 1 capsule (500 mg total) by mouth 4 (four) times daily for 5 days., Disp: 20 capsule, Rfl: 0   Cholecalciferol (VITAMIN D-3 PO), Take 1 capsule by mouth daily., Disp: , Rfl:    Cyanocobalamin (B-12 PO), Take by mouth., Disp: , Rfl:    cyclobenzaprine (FLEXERIL) 5 MG tablet, Take 1-2 tablets (5-10 mg total) by mouth every 8 (eight) hours as needed for muscle spasms., Disp: 60 tablet, Rfl: 6   fluconazole (DIFLUCAN) 150 MG tablet, Take 1 tablet (150 mg total) by mouth every other day as needed., Disp: 2 tablet, Rfl: 2   fluticasone (FLONASE) 50 MCG/ACT nasal spray, Place 2  sprays into both nostrils 2 (two) times daily. , Disp: , Rfl:    ipratropium-albuterol (DUONEB) 0.5-2.5 (3) MG/3ML SOLN, Take 3 mLs by nebulization every 4 (four) hours as needed for up to 20 days. (Patient taking differently: Take 3 mLs by nebulization as needed.), Disp: 360 mL, Rfl: 0   methylPREDNISolone (MEDROL DOSEPAK) 4 MG TBPK tablet, Take pills daily all together with food. Take the first dose (6 pills) as soon as possible. Take the rest each morning. For 6 days total 6-5-4-3-2-1., Disp: 21 tablet, Rfl: 1   NURTEC 75 MG TBDP, TAKE 1 TABLET BY MOUTH DAILY AS NEEDED, Disp: 8  tablet, Rfl: 8   ondansetron (ZOFRAN-ODT) 4 MG disintegrating tablet, Take 1-2 tablets (4-8 mg total) by mouth every 8 (eight) hours as needed., Disp: 30 tablet, Rfl: 11   Objective There were no vitals filed for this visit.  Gen: NAD CV: S1 S2 RRR Lungs: Clear to auscultation bilaterally Abd: soft, nontender   Previous Pelvic Exam showed: On exam, swelling noted to the right of the urethra with blood discharge expelled from the skenes gland orifice. Some swelling noted vaginally.        Assessment/ Plan  Assessment: The patient is a 47 y.o. year old scheduled to undergo Excision of skene's gland cyst and cystoscopy.   Plan: General Surgical Consent: The patient has previously been counseled on alternative treatments, and the decision by the patient and provider was to proceed with the procedure listed above.  For all procedures, there are risks of bleeding, infection, damage to surrounding organs including but not limited to bowel, bladder, blood vessels, ureters and nerves, and need for further surgery if an injury were to occur. These risks are all low with minimally invasive surgery.  Very rare risks include blood transfusion, blood clot, heart attack, pneumonia, or death.   There is also a risk of short-term postoperative urinary retention with need to use a catheter. About half of patients need to go home from surgery with a catheter, which is then later removed in the office. The risk of long-term need for a catheter is very low.    Marguerita Beards, MD

## 2023-02-07 ENCOUNTER — Other Ambulatory Visit: Payer: Self-pay

## 2023-02-07 ENCOUNTER — Encounter (HOSPITAL_BASED_OUTPATIENT_CLINIC_OR_DEPARTMENT_OTHER): Payer: Self-pay | Admitting: Obstetrics and Gynecology

## 2023-02-07 NOTE — Progress Notes (Signed)
Spoke w/ via phone for pre-op interview---[facility] needs dos---- none        Lab results------02/03/23 cbc, cmp in Epic COVID test -----patient states asymptomatic no test needed Arrive at -------1315 on Monday, 02/10/2023 NPO after MN NO Solid Food.  Clear liquids from MN until---1215 Med rec completed Medications to take morning of surgery -----[name] / If needed you may take: Albuterol inhaler, Flexeril, Diflucan, Duoneb, Nurtec & Zofran Diabetic medication -----n/a Patient instructed no nail polish to be worn day of surgery Patient instructed to bring photo id and insurance card day of surgery Patient aware to have Driver (ride ) / caregiver    for 24 hours after surgery - husband, Stacy Ochoa Patient Special Instructions -----none Pre-Op special Instructions -----none Patient verbalized understanding of instructions that were given at this phone interview. Patient denies chest pain, sob, fever, cough at the interview.

## 2023-02-09 NOTE — Plan of Care (Signed)
[facility] Tonsillectomy/Adenoidectomy, Postoperative PEDS care plan entered in error.

## 2023-02-10 ENCOUNTER — Encounter (HOSPITAL_BASED_OUTPATIENT_CLINIC_OR_DEPARTMENT_OTHER)
Admission: RE | Disposition: A | Discharge status: Home / Self Care | Payer: Self-pay | Source: Home / Self Care | Attending: Obstetrics and Gynecology

## 2023-02-10 ENCOUNTER — Ambulatory Visit (HOSPITAL_BASED_OUTPATIENT_CLINIC_OR_DEPARTMENT_OTHER): Payer: 59 | Admitting: Certified Registered"

## 2023-02-10 ENCOUNTER — Other Ambulatory Visit: Payer: Self-pay

## 2023-02-10 ENCOUNTER — Ambulatory Visit (HOSPITAL_BASED_OUTPATIENT_CLINIC_OR_DEPARTMENT_OTHER)
Admission: RE | Admit: 2023-02-10 | Discharge: 2023-02-10 | Disposition: A | Discharge status: Home / Self Care | Payer: 59 | Attending: Obstetrics and Gynecology | Admitting: Obstetrics and Gynecology

## 2023-02-10 ENCOUNTER — Encounter (HOSPITAL_BASED_OUTPATIENT_CLINIC_OR_DEPARTMENT_OTHER): Payer: Self-pay | Admitting: Obstetrics and Gynecology

## 2023-02-10 DIAGNOSIS — Z01818 Encounter for other preprocedural examination: Secondary | ICD-10-CM

## 2023-02-10 DIAGNOSIS — N368 Other specified disorders of urethra: Secondary | ICD-10-CM | POA: Insufficient documentation

## 2023-02-10 DIAGNOSIS — J45909 Unspecified asthma, uncomplicated: Secondary | ICD-10-CM | POA: Diagnosis not present

## 2023-02-10 HISTORY — PX: URETHRAL CYST REMOVAL: SHX5128

## 2023-02-10 HISTORY — PX: CYSTOSCOPY: SHX5120

## 2023-02-10 HISTORY — DX: Presence of spectacles and contact lenses: Z97.3

## 2023-02-10 SURGERY — EXCISION, CYST, PERIURETHRAL
Anesthesia: General | Site: Vagina

## 2023-02-10 MED ORDER — ONDANSETRON HCL 4 MG/2ML IJ SOLN: 4.0000 mg | Freq: Once | INTRAMUSCULAR | Status: DC | PRN

## 2023-02-10 MED ORDER — EPHEDRINE SULFATE-NACL 50-0.9 MG/10ML-% IV SOSY
PREFILLED_SYRINGE | INTRAVENOUS | Status: DC | PRN
  Administered 2023-02-10: 10 mg via INTRAVENOUS
  Administered 2023-02-10: 5 mg via INTRAVENOUS

## 2023-02-10 MED ORDER — CEFAZOLIN SODIUM-DEXTROSE 2-4 GM/100ML-% IV SOLN
2.0000 g | INTRAVENOUS | Status: AC
  Administered 2023-02-10: 2 g via INTRAVENOUS

## 2023-02-10 MED ORDER — FENTANYL CITRATE (PF) 100 MCG/2ML IJ SOLN
INTRAMUSCULAR | Status: DC | PRN
  Administered 2023-02-10: 25 ug via INTRAVENOUS
  Administered 2023-02-10: 50 ug via INTRAVENOUS
  Administered 2023-02-10: 25 ug via INTRAVENOUS

## 2023-02-10 MED ORDER — FENTANYL CITRATE (PF) 100 MCG/2ML IJ SOLN
INTRAMUSCULAR | Status: AC
  Filled 2023-02-10: qty 2

## 2023-02-10 MED ORDER — CEFAZOLIN SODIUM-DEXTROSE 2-4 GM/100ML-% IV SOLN
INTRAVENOUS | Status: AC
  Filled 2023-02-10: qty 100

## 2023-02-10 MED ORDER — LIDOCAINE-EPINEPHRINE 1 %-1:100000 IJ SOLN
INTRAMUSCULAR | Status: DC | PRN
  Administered 2023-02-10: 3 mL

## 2023-02-10 MED ORDER — MIDAZOLAM HCL 2 MG/2ML IJ SOLN
INTRAMUSCULAR | Status: AC
  Filled 2023-02-10: qty 2

## 2023-02-10 MED ORDER — LIDOCAINE HCL (PF) 2 % IJ SOLN
INTRAMUSCULAR | Status: AC
  Filled 2023-02-10: qty 5

## 2023-02-10 MED ORDER — PROPOFOL 10 MG/ML IV BOLUS
INTRAVENOUS | Status: AC
  Filled 2023-02-10: qty 20

## 2023-02-10 MED ORDER — OXYCODONE HCL 5 MG/5ML PO SOLN: 5.0000 mg | Freq: Once | ORAL | Status: DC | PRN

## 2023-02-10 MED ORDER — ACETAMINOPHEN 325 MG PO TABS: 325.0000 mg | ORAL_TABLET | ORAL | Status: DC | PRN

## 2023-02-10 MED ORDER — FUROSEMIDE 10 MG/ML IJ SOLN
INTRAMUSCULAR | Status: AC
  Filled 2023-02-10: qty 2

## 2023-02-10 MED ORDER — FENTANYL CITRATE (PF) 100 MCG/2ML IJ SOLN: 25.0000 ug | INTRAMUSCULAR | Status: DC | PRN

## 2023-02-10 MED ORDER — ACETAMINOPHEN 500 MG PO TABS: 500.0000 mg | ORAL_TABLET | Freq: Four times a day (QID) | ORAL | 0 refills | Status: AC | PRN

## 2023-02-10 MED ORDER — EPHEDRINE 5 MG/ML INJ
INTRAVENOUS | Status: AC
  Filled 2023-02-10: qty 5

## 2023-02-10 MED ORDER — OXYCODONE HCL 5 MG PO TABS: 5.0000 mg | ORAL_TABLET | ORAL | 0 refills | Status: AC | PRN

## 2023-02-10 MED ORDER — ACETAMINOPHEN 160 MG/5ML PO SOLN: 325.0000 mg | ORAL | Status: DC | PRN

## 2023-02-10 MED ORDER — LIDOCAINE 2% (20 MG/ML) 5 ML SYRINGE
INTRAMUSCULAR | Status: DC | PRN
  Administered 2023-02-10: 80 mg via INTRAVENOUS

## 2023-02-10 MED ORDER — ONDANSETRON HCL 4 MG/2ML IJ SOLN
INTRAMUSCULAR | Status: AC
  Filled 2023-02-10: qty 2

## 2023-02-10 MED ORDER — LACTATED RINGERS IV SOLN: INTRAVENOUS | Status: DC

## 2023-02-10 MED ORDER — DEXAMETHASONE SODIUM PHOSPHATE 10 MG/ML IJ SOLN
INTRAMUSCULAR | Status: DC | PRN
  Administered 2023-02-10: 10 mg via INTRAVENOUS

## 2023-02-10 MED ORDER — PHENAZOPYRIDINE HCL 100 MG PO TABS
ORAL_TABLET | ORAL | Status: AC
  Filled 2023-02-10: qty 2

## 2023-02-10 MED ORDER — MEPERIDINE HCL 25 MG/ML IJ SOLN: 6.2500 mg | INTRAMUSCULAR | Status: DC | PRN

## 2023-02-10 MED ORDER — STERILE WATER FOR IRRIGATION IR SOLN
Status: DC | PRN
  Administered 2023-02-10: 500 mL

## 2023-02-10 MED ORDER — DEXAMETHASONE SODIUM PHOSPHATE 10 MG/ML IJ SOLN
INTRAMUSCULAR | Status: AC
  Filled 2023-02-10: qty 1

## 2023-02-10 MED ORDER — PROPOFOL 10 MG/ML IV BOLUS
INTRAVENOUS | Status: DC | PRN
  Administered 2023-02-10: 180 mg via INTRAVENOUS

## 2023-02-10 MED ORDER — SODIUM CHLORIDE 0.9 % IR SOLN
Status: DC | PRN
  Administered 2023-02-10: 1000 mL via INTRAVESICAL

## 2023-02-10 MED ORDER — ACETAMINOPHEN 10 MG/ML IV SOLN: 1000.0000 mg | Freq: Once | INTRAVENOUS | Status: DC | PRN

## 2023-02-10 MED ORDER — OXYCODONE HCL 5 MG PO TABS: 5.0000 mg | ORAL_TABLET | Freq: Once | ORAL | Status: DC | PRN

## 2023-02-10 MED ORDER — PHENAZOPYRIDINE HCL 100 MG PO TABS
200.0000 mg | ORAL_TABLET | ORAL | Status: AC
  Administered 2023-02-10: 200 mg via ORAL

## 2023-02-10 MED ORDER — DEXMEDETOMIDINE HCL IN NACL 80 MCG/20ML IV SOLN
INTRAVENOUS | Status: DC | PRN
  Administered 2023-02-10 (×3): 4 ug via INTRAVENOUS

## 2023-02-10 MED ORDER — ONDANSETRON HCL 4 MG/2ML IJ SOLN
INTRAMUSCULAR | Status: DC | PRN
  Administered 2023-02-10: 4 mg via INTRAVENOUS

## 2023-02-10 MED ORDER — MIDAZOLAM HCL 5 MG/5ML IJ SOLN
INTRAMUSCULAR | Status: DC | PRN
  Administered 2023-02-10: 2 mg via INTRAVENOUS

## 2023-02-10 SURGICAL SUPPLY — 28 items
AGENT HMST KT MTR STRL THRMB (HEMOSTASIS)
BLADE CLIPPER SENSICLIP SURGIC (BLADE) ×2 IMPLANT
BLADE SURG 15 STRL LF DISP TIS (BLADE) ×2 IMPLANT
BLADE SURG 15 STRL SS (BLADE) ×2
GAUZE 4X4 16PLY ~~LOC~~+RFID DBL (SPONGE) IMPLANT
GLOVE BIOGEL PI IND STRL 6.5 (GLOVE) ×2 IMPLANT
GLOVE ECLIPSE 6.0 STRL STRAW (GLOVE) ×2 IMPLANT
GOWN STRL REUS W/TWL LRG LVL3 (GOWN DISPOSABLE) ×2 IMPLANT
HOLDER FOLEY CATH W/STRAP (MISCELLANEOUS) ×2 IMPLANT
KIT TURNOVER CYSTO (KITS) ×2 IMPLANT
NDL HYPO 22X1.5 SAFETY MO (MISCELLANEOUS) ×2 IMPLANT
NEEDLE HYPO 22X1.5 SAFETY MO (MISCELLANEOUS) ×2 IMPLANT
NS IRRIG 1000ML POUR BTL (IV SOLUTION) ×2 IMPLANT
PACK VAGINAL WOMENS (CUSTOM PROCEDURE TRAY) ×2 IMPLANT
RETRACTOR LONE STAR DISPOSABLE (INSTRUMENTS) ×2 IMPLANT
RETRACTOR STAY HOOK 5MM (MISCELLANEOUS) ×2 IMPLANT
SCRUB CHG 4% DYNA-HEX 4OZ (MISCELLANEOUS) ×2 IMPLANT
SET IRRIG Y TYPE TUR BLADDER L (SET/KITS/TRAYS/PACK) IMPLANT
SLEEVE SCD COMPRESS KNEE MED (STOCKING) ×2 IMPLANT
SPIKE FLUID TRANSFER (MISCELLANEOUS) IMPLANT
SURGIFLO W/THROMBIN 8M KIT (HEMOSTASIS) IMPLANT
SUT VIC AB 2-0 SH 18 (SUTURE) ×2 IMPLANT
SUT VIC AB 2-0 SH 27 (SUTURE)
SUT VIC AB 2-0 SH 27XBRD (SUTURE) IMPLANT
SUT VICRYL 2-0 SH 8X27 (SUTURE) IMPLANT
SYR BULB EAR ULCER 3OZ GRN STR (SYRINGE) ×2 IMPLANT
TOWEL OR 17X24 6PK STRL BLUE (TOWEL DISPOSABLE) ×2 IMPLANT
TRAY FOLEY W/BAG SLVR 14FR LF (SET/KITS/TRAYS/PACK) ×2 IMPLANT

## 2023-02-10 NOTE — Transfer of Care (Signed)
Immediate Anesthesia Transfer of Care Note  Patient: Stacy Ochoa  Procedure(s) Performed: EXCISION URETHRAL CYST (Vagina ) CYSTOSCOPY (Bladder)  Patient Location: PACU  Anesthesia Type:General  Level of Consciousness: awake, alert , and oriented  Airway & Oxygen Therapy: Patient Spontanous Breathing and Patient connected to nasal cannula oxygen  Post-op Assessment: Report given to RN and Post -op Vital signs reviewed and stable  Post vital signs: Reviewed and stable  Last Vitals:  Vitals Value Taken Time  BP 111/73 02/10/23 1542  Temp    Pulse 90 02/10/23 1544  Resp 14 02/10/23 1544  SpO2 100 % 02/10/23 1544  Vitals shown include unfiled device data.  Last Pain:  Vitals:   02/10/23 1258  TempSrc: Oral         Complications: No notable events documented.

## 2023-02-10 NOTE — Anesthesia Procedure Notes (Signed)
Procedure Name: LMA Insertion Date/Time: 02/10/2023 2:44 PM  Performed by: Rakayla Ricklefs D, CRNAPre-anesthesia Checklist: Patient identified, Emergency Drugs available, Suction available and Patient being monitored Patient Re-evaluated:Patient Re-evaluated prior to induction Oxygen Delivery Method: Circle system utilized Preoxygenation: Pre-oxygenation with 100% oxygen Induction Type: IV induction Ventilation: Mask ventilation without difficulty LMA: LMA inserted LMA Size: 4.0 Tube type: Oral Number of attempts: 1 Placement Confirmation: positive ETCO2 and breath sounds checked- equal and bilateral Tube secured with: Tape Dental Injury: Teeth and Oropharynx as per pre-operative assessment

## 2023-02-10 NOTE — Anesthesia Postprocedure Evaluation (Signed)
Anesthesia Post Note  Patient: Stacy Ochoa  Procedure(s) Performed: EXCISION URETHRAL CYST (Vagina ) CYSTOSCOPY (Bladder)     Patient location during evaluation: PACU Anesthesia Type: General Level of consciousness: awake Pain management: pain level controlled Vital Signs Assessment: post-procedure vital signs reviewed and stable Respiratory status: spontaneous breathing, nonlabored ventilation and respiratory function stable Cardiovascular status: blood pressure returned to baseline and stable Postop Assessment: no apparent nausea or vomiting Anesthetic complications: no   No notable events documented.  Last Vitals:  Vitals:   02/10/23 1600 02/10/23 1615  BP: 114/80 111/77  Pulse: 84 75  Resp: 11 (!) 8  Temp:    SpO2: 99% 98%    Last Pain:  Vitals:   02/10/23 1615  TempSrc:   PainSc: 0-No pain                 Linton Rump

## 2023-02-10 NOTE — Anesthesia Preprocedure Evaluation (Signed)
Anesthesia Evaluation  Patient identified by MRN, date of birth, ID band Patient awake    Reviewed: Allergy & Precautions, NPO status , Patient's Chart, lab work & pertinent test results  History of Anesthesia Complications (+) PONV and history of anesthetic complications  Airway Mallampati: I       Dental no notable dental hx. (+) Teeth Intact   Pulmonary asthma    Pulmonary exam normal        Cardiovascular negative cardio ROS Normal cardiovascular exam     Neuro/Psych  Headaches  negative psych ROS   GI/Hepatic negative GI ROS, Neg liver ROS,,,  Endo/Other  negative endocrine ROS    Renal/GU negative Renal ROS  negative genitourinary   Musculoskeletal negative musculoskeletal ROS (+)    Abdominal  (+) + obese  Peds  Hematology negative hematology ROS (+)   Anesthesia Other Findings   Reproductive/Obstetrics negative OB ROS                             Anesthesia Physical Anesthesia Plan  ASA: II  Anesthesia Plan: General   Post-op Pain Management:    Induction: Intravenous  PONV Risk Score and Plan: 4 or greater and Ondansetron, Dexamethasone, Midazolam and Scopolamine patch - Pre-op  Airway Management Planned: LMA  Additional Equipment: None  Intra-op Plan:   Post-operative Plan: Extubation in OR  Informed Consent: I have reviewed the patients History and Physical, chart, labs and discussed the procedure including the risks, benefits and alternatives for the proposed anesthesia with the patient or authorized representative who has indicated his/her understanding and acceptance.     Dental advisory given  Plan Discussed with: CRNA  Anesthesia Plan Comments:         Anesthesia Quick Evaluation

## 2023-02-10 NOTE — Interval H&P Note (Signed)
History and Physical Interval Note:  02/10/2023 2:15 PM  Stacy Ochoa  has presented today for surgery, with the diagnosis of urethral abscess.  The various methods of treatment have been discussed with the patient and family. After consideration of risks, benefits and other options for treatment, the patient has consented to  Procedure(s): EXCISION URETHRAL CYST (N/A) and CYSTOSCOPY as a surgical intervention.  The patient's history has been reviewed, patient examined, no change in status, stable for surgery.  I have reviewed the patient's chart and labs.  Questions were answered to the patient's satisfaction.     Marguerita Beards

## 2023-02-10 NOTE — Discharge Instructions (Addendum)

## 2023-02-10 NOTE — Op Note (Signed)
Operative Note  Preoperative Diagnosis:  Skene's gland cyst  Postoperative Diagnosis: same  Procedures performed:  Excision of skene's gland cyst, cystoscopy  Implants: none  Attending Surgeon: Lanetta Inch, MD  Assistant Surgeon: Jay Schlichter, MD  Anesthesia: General LMA  Findings: 1cm skene's gland cyst with milky discharge   On cystoscopy, normal bladder and urethral mucosa without injury.   Specimens:  ID Type Source Tests Collected by Time Destination  1 : skene gland cyst Tissue PATH Gyn biopsy SURGICAL PATHOLOGY Marguerita Beards, MD 02/10/2023 1515     Estimated blood loss: 100 mL  IV fluids: 750 mL  Urine output: 150 mL  Complications: none  Procedure in Detail:  After informed consent was obtained, the patient was taken to the operating room where anesthesia was induced and found to be adequate. She was placed in dorsal lithotomy position, taking care to avoid any nerve injury, and prepped and draped in the usual sterile fashion. A foley catheter was placed. The skene's gland cyst was noted to be protruding into the anterior vagina to the right and lateral to the urethra. An allis clamp was place on either side of the cyst on the vagina and the epithelium was injected with 1% lidocaine with epinephrine. Dissection was performed with metzenbaum scissors until the cyst wall was reached. The cyst was grasped with an allis clamp and incised. Milky discharge was expelled from the cyst. The cyst wall was then grasped and dissected with metzenbaum scissors and removed. The foley was removed. A cystourethroscopy was performed with a 70 degree cystoscope. The bladder mucosa appeared normal without any injury and bilateral ureteral efflux was noted. The cystoscope was slowly removed from the urethra and the urethral mucosa was noted to be intact. The deeper layers of tissue were then closed with 2-0 vicryl interrupted sutures. Good hemostasis was noted. The incision was then  closed with a 2-0 vicryl suture in a running fashion. Irrigation was performed and again hemostasis was noted. The patient tolerated the procedure well. She was awoken and taken to the recovery room in stable condition. Needle and sponge counts were correct x2.   Marguerita Beards, MD

## 2023-02-12 ENCOUNTER — Encounter (HOSPITAL_BASED_OUTPATIENT_CLINIC_OR_DEPARTMENT_OTHER): Payer: Self-pay | Admitting: Obstetrics and Gynecology

## 2023-02-13 LAB — SURGICAL PATHOLOGY

## 2023-02-17 ENCOUNTER — Emergency Department (HOSPITAL_COMMUNITY): Payer: 59

## 2023-02-17 ENCOUNTER — Encounter: Payer: Self-pay | Admitting: Obstetrics

## 2023-02-17 ENCOUNTER — Emergency Department (HOSPITAL_COMMUNITY)
Admission: EM | Admit: 2023-02-17 | Discharge: 2023-02-17 | Disposition: A | Discharge status: Home / Self Care | Payer: 59

## 2023-02-17 ENCOUNTER — Ambulatory Visit (INDEPENDENT_AMBULATORY_CARE_PROVIDER_SITE_OTHER): Payer: 59 | Admitting: Obstetrics

## 2023-02-17 ENCOUNTER — Encounter (HOSPITAL_COMMUNITY): Payer: Self-pay | Admitting: Emergency Medicine

## 2023-02-17 ENCOUNTER — Other Ambulatory Visit: Payer: Self-pay

## 2023-02-17 ENCOUNTER — Other Ambulatory Visit (HOSPITAL_COMMUNITY)
Admission: RE | Admit: 2023-02-17 | Discharge: 2023-02-17 | Disposition: A | Discharge status: Home / Self Care | Payer: 59 | Source: Ambulatory Visit

## 2023-02-17 VITALS — BP 102/78 | HR 133

## 2023-02-17 DIAGNOSIS — R002 Palpitations: Secondary | ICD-10-CM | POA: Insufficient documentation

## 2023-02-17 DIAGNOSIS — N898 Other specified noninflammatory disorders of vagina: Secondary | ICD-10-CM

## 2023-02-17 DIAGNOSIS — B3731 Acute candidiasis of vulva and vagina: Secondary | ICD-10-CM | POA: Insufficient documentation

## 2023-02-17 DIAGNOSIS — R000 Tachycardia, unspecified: Secondary | ICD-10-CM

## 2023-02-17 LAB — CBC
HCT: 41.7 % (ref 36.0–46.0)
Hemoglobin: 14 g/dL (ref 12.0–15.0)
MCH: 30.3 pg (ref 26.0–34.0)
MCHC: 33.6 g/dL (ref 30.0–36.0)
MCV: 90.3 fL (ref 80.0–100.0)
Platelets: 283 10*3/uL (ref 150–400)
RBC: 4.62 MIL/uL (ref 3.87–5.11)
RDW: 12.4 % (ref 11.5–15.5)
WBC: 9.8 10*3/uL (ref 4.0–10.5)
nRBC: 0 % (ref 0.0–0.2)

## 2023-02-17 LAB — URINALYSIS, ROUTINE W REFLEX MICROSCOPIC
Bilirubin Urine: NEGATIVE
Glucose, UA: NEGATIVE mg/dL
Ketones, ur: NEGATIVE mg/dL
Nitrite: NEGATIVE
Protein, ur: NEGATIVE mg/dL
Specific Gravity, Urine: 1.017 (ref 1.005–1.030)
pH: 8 (ref 5.0–8.0)

## 2023-02-17 LAB — BASIC METABOLIC PANEL
Anion gap: 9 (ref 5–15)
BUN: 18 mg/dL (ref 6–20)
CO2: 21 mmol/L — ABNORMAL LOW (ref 22–32)
Calcium: 9.8 mg/dL (ref 8.9–10.3)
Chloride: 107 mmol/L (ref 98–111)
Creatinine, Ser: 0.72 mg/dL (ref 0.44–1.00)
GFR, Estimated: >60 mL/min (ref >60)
Glucose, Bld: 99 mg/dL (ref 70–99)
Potassium: 3.9 mmol/L (ref 3.5–5.1)
Sodium: 137 mmol/L (ref 135–145)

## 2023-02-17 LAB — D-DIMER, QUANTITATIVE: D-Dimer, Quant: 0.31 ug{FEU}/mL (ref 0.00–0.50)

## 2023-02-17 LAB — CBG MONITORING, ED: Glucose-Capillary: 100 mg/dL — ABNORMAL HIGH (ref 70–99)

## 2023-02-17 NOTE — ED Notes (Signed)
Pt ambulatory to bathroom, assisted by husband

## 2023-02-17 NOTE — Discharge Instructions (Addendum)
Please follow-up to primary doctor.  We would also like you to see cardiology.  Please call and schedule an appointment if you do not hear back from them.  Return immediately develop fevers, chills, chest pain, shortness of breath, passout, symptoms persist or he develop any new or worsening symptoms that are concerning to you.

## 2023-02-17 NOTE — Progress Notes (Signed)
[facility]  Date of Visit: 02/17/2023  History of Present Illness: Stacy Ochoa is a 47 y.o. female scheduled today for a post-operative visit.   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 Denies dysuria, hematuria.   Postoperative course was uncomplicated.   Reports doing well until feeling warm and dizzy 2 days ago on POD#5 with Tmax 100 on Saturday, Denies syncope.  Today she reports waking up with palpitations, fatigue and vaginal discharge Reports minimal yellow vaginal discharge only when she wipes, denies bleeding.  Reports intermittent 3/10 sharp pain when she sits down. Denies pain medications.  Denies N/V, tolerating regular diet.  Resumed migraine and HLD medications. Denies sick contact, history of CAD, family history of MI or VTEs. History of well controlled asthma.  Reports normal bowel movements, denies diarrhea.  History of SOB in 2023 after COVID and history of asthma managed by Dr. Wardell Heath. Patient reports well controlled with PRN meds only. History of vertigo  Pathology results: epidermal inclusion cyst  Medications: She has a current medication list which includes the following prescription(s): acetaminophen, albuterol, ascorbic acid, qulipta, cholecalciferol, cyanocobalamin, cyclobenzaprine, fluconazole, fluticasone, methylprednisolone, nurtec, ondansetron, oxycodone, and ipratropium-albuterol.   Allergies: Patient is allergic to aspirin, nsaids, and shellfish allergy.   Physical Exam: Orthostatic vitals Today's Vitals   02/17/23 1333 02/17/23 1512 02/17/23 1513  BP: 121/87 118/87 102/78  Pulse: (!) 116 (!) 108 (!) 133   There is no height or weight on file to calculate BMI.  Vaginal Incisions: healing well, no significant drainage, no dehiscence, no significant erythema.  Pelvic Examination: Vagina: Incisions healing well. Sutures are present at incision line and there is a small amount of  granulation tissue at the inferior portion of incision. No tenderness along the anterior or posterior vagina, no purulent discharge expressed.  ---------------------------------------------------------  Assessment and Plan:  1. Vaginal discharge   2. Tachycardia    - patient reports significant dyspnea with episodes of tachycardia to HR 133 while standing, history of migraine and vertigo however patient reports well controlled and denies headaches or visual changes today. Denies dehydration or hypoglycemia and reports normal PO intake today. During bedside evaluation, episode of difficulty recalling where her husband worked and left arm tingling with dyspnea. Due to acute change in clinical status, offered emergency transfer via ambulance, patient declines and desired transport by her friend. Patient transported via wheelchair and instructed to present to [facility] immediately. Charge nurse notified by office, concerns for acute exacerbation of vertigo vs. ACS/VTE.  - Dr. Florian Buff notified - normal appearing vaginal epithelium with normal appearing incision and intact sutures, no erythema or discharge to suggest infection. Vaginal swab pending to r/o infectious etiology, however unlikely based on exam. No CVA tenderness and denies UTI symptoms, likely contaminated UA if collected due to recent instrumentation and catheterization for surgery. - Pathology results were reviewed with the patient today and she verbalized understanding that the results were benign.    All questions answered.   Return in about 1 week (around 02/24/2023).

## 2023-02-17 NOTE — ED Provider Notes (Signed)
[place] EMERGENCY DEPARTMENT AT [facility] Provider Note   CSN: [id number] Arrival date & time: 02/17/23  1513     History  Chief Complaint  Patient presents with   Post-op Problem    Stacy Ochoa is a 47 y.o. female.  Evaluation of lightheadedness and palpitations.  Patient was at outpatient visit when she had some palpitations and lightheadedness.  Noted to have an elevated heart rate in the emergency department.  Patient states that the past several days she has had some intermittent episodes lasting for several minutes where she feels her heart races and she feels lightheaded like she may pass out.  No chest pain or shortness of breath.  No abdominal pain.        Home Medications Prior to Admission medications   Medication Sig Start Date End Date Taking? Authorizing Provider  acetaminophen (TYLENOL) 500 MG tablet Take 1 tablet (500 mg total) by mouth every 6 (six) hours as needed (pain). 02/10/23   Marguerita Beards, MD  albuterol (VENTOLIN HFA) 108 (90 Base) MCG/ACT inhaler Inhale 1-2 puffs into the lungs every 6 (six) hours as needed for wheezing or shortness of breath. Patient taking differently: Inhale 1-2 puffs into the lungs as needed for wheezing or shortness of breath. 12/18/21   Glyn Ade, MD  Ascorbic Acid (VITAMIN C PO) Take by mouth.    [provider]  Atogepant (QULIPTA) 60 MG TABS TAKE 1 TABLET BY MOUTH DAILY Patient taking differently: Take 1 tablet by mouth at bedtime. 01/09/23   Anson Fret, MD  Cholecalciferol (VITAMIN D-3 PO) Take 1 capsule by mouth daily.    [provider]  Cyanocobalamin (B-12 PO) Take by mouth.    [provider]  cyclobenzaprine (FLEXERIL) 5 MG tablet Take 1-2 tablets (5-10 mg total) by mouth every 8 (eight) hours as needed for muscle spasms. 10/29/22   Anson Fret, MD  fluconazole (DIFLUCAN) 150 MG tablet Take 1 tablet (150 mg total) by mouth every other day as needed.  02/03/23   Lo, Toma Aran, CNM  fluticasone (FLONASE) 50 MCG/ACT nasal spray Place 2 sprays into both nostrils 2 (two) times daily.  09/02/17   [provider]  ipratropium-albuterol (DUONEB) 0.5-2.5 (3) MG/3ML SOLN Take 3 mLs by nebulization every 4 (four) hours as needed for up to 20 days. 12/18/21 10/29/22  Glyn Ade, MD  methylPREDNISolone (MEDROL DOSEPAK) 4 MG TBPK tablet Take pills daily all together with food. Take the first dose (6 pills) as soon as possible. Take the rest each morning. For 6 days total 6-5-4-3-2-1. Patient taking differently: as needed. Take pills daily all together with food. Take the first dose (6 pills) as soon as possible. Take the rest each morning. For 6 days total 6-5-4-3-2-1.  For migraines. 10/29/22   Anson Fret, MD  NURTEC 75 MG TBDP TAKE 1 TABLET BY MOUTH DAILY AS NEEDED 01/16/23   Anson Fret, MD  ondansetron (ZOFRAN-ODT) 4 MG disintegrating tablet Take 1-2 tablets (4-8 mg total) by mouth every 8 (eight) hours as needed. 10/29/22   Anson Fret, MD  oxyCODONE (OXY IR/ROXICODONE) 5 MG immediate release tablet Take 1 tablet (5 mg total) by mouth every 4 (four) hours as needed for severe pain (pain score 7-10). 02/10/23   Marguerita Beards, MD      Allergies    Aspirin, Nsaids, and Shellfish allergy    Review of Systems   Review of Systems  Physical Exam Updated Vital  Signs BP 111/71   Pulse 97   Temp 98.3 F (36.8 C) (Oral)   Resp 14   Ht 5\' 1"  (1.549 m)   Wt 88.1 kg   LMP 11/06/2017 (Exact Date)   SpO2 97%   BMI 36.69 kg/m  Physical Exam Vitals and nursing note reviewed.  Constitutional:      General: She is not in acute distress. HENT:     Head: Normocephalic and atraumatic.     Nose: Nose normal.     Mouth/Throat:     Mouth: Mucous membranes are moist.  Eyes:     Conjunctiva/sclera: Conjunctivae normal.  Cardiovascular:     Rate and Rhythm: Normal rate and regular rhythm.     Pulses: Normal pulses.  Pulmonary:      Effort: Pulmonary effort is normal.  Abdominal:     General: Abdomen is flat. There is no distension.     Tenderness: There is no abdominal tenderness. There is no guarding or rebound.  Musculoskeletal:        General: Normal range of motion.  Skin:    General: Skin is warm and dry.     Capillary Refill: Capillary refill takes less than 2 seconds.  Neurological:     Mental Status: She is alert and oriented to person, place, and time.  Psychiatric:        Mood and Affect: Mood normal.        Behavior: Behavior normal.     ED Results / Procedures / Treatments   Labs (all labs ordered are listed, but only abnormal results are displayed) Labs Reviewed  BASIC METABOLIC PANEL - Abnormal; Notable for the following components:      Result Value   CO2 21 (*)    All other components within normal limits  URINALYSIS, ROUTINE W REFLEX MICROSCOPIC - Abnormal; Notable for the following components:   Hgb urine dipstick SMALL (*)    Leukocytes,Ua MODERATE (*)    Bacteria, UA RARE (*)    All other components within normal limits  CBG MONITORING, ED - Abnormal; Notable for the following components:   Glucose-Capillary 100 (*)    All other components within normal limits  CBC  D-DIMER, QUANTITATIVE    EKG EKG Interpretation Date/Time:  Monday February 17 2023 15:22:42 EDT Ventricular Rate:  99 PR Interval:  136 QRS Duration:  90 QT Interval:  359 QTC Calculation: 461 R Axis:   86  Text Interpretation: Sinus rhythm Right atrial enlargement Abnormal R-wave progression, early transition Minimal ST depression, diffuse leads Confirmed by Estanislado Pandy [phone]) on 02/17/2023 6:11:10 PM  Radiology DG Chest Portable 1 View  Result Date: 02/17/2023 CLINICAL DATA:  Near syncope EXAM: PORTABLE CHEST 1 VIEW COMPARISON:  12/21/2021 FINDINGS: Normal heart size.The cardiomediastinal contours are normal. The lungs are clear. Pulmonary vasculature is normal. No consolidation, pleural effusion, or  pneumothorax. No acute osseous abnormalities are seen. IMPRESSION: No acute chest findings. Electronically Signed   By: Narda Rutherford M.D.   On: 02/17/2023 21:16    Procedures Procedures    Medications Ordered in ED Medications - No data to display  ED Course/ Medical Decision Making/ A&P Clinical Course as of 02/17/23 2142  Mon Feb 17, 2023  1642 Per chart review from urogyn visit today: "Assessment and Plan:  1. Vaginal discharge  2. Tachycardia    - patient reports significant dyspnea with episodes of tachycardia to HR 133 while standing, history of migraine and vertigo however patient reports well controlled and  denies headaches or visual changes today. Denies dehydration or hypoglycemia and reports normal PO intake today. During bedside evaluation, episode of difficulty recalling where her husband worked and left arm tingling with dyspnea. Due to acute change in clinical status, offered emergency transfer via ambulance, patient declines and desired transport by her friend. Patient transported via wheelchair and instructed to present to [facility] immediately. Charge nurse notified by office, concerns for acute exacerbation of vertigo vs. ACS/VTE.  - Dr. Florian Buff notified - normal appearing vaginal epithelium with normal appearing incision and intact sutures, no erythema or discharge to suggest infection. Vaginal swab pending to r/o infectious etiology, however unlikely based on exam. No CVA tenderness and denies UTI symptoms, likely contaminated UA if collected due to recent instrumentation and catheterization for surgery. - Pathology results were reviewed with the patient today and she verbalized understanding that the results were benign." [TY]    Clinical Course User Index [TY] Coral Spikes, DO                                 Medical Decision Making Is a 47 year old female present emergency department for palpitations.  She is afebrile, heart rate initially documented at  133, but subsequent readings all been nontachycardic.  EKG appears to be normal sinus rhythm with normal intervals, no AV block, QTc 3 prolongation, no ST segment changes indicate ischemia.  ACS unlikely given history.  No fever or leukocytosis to suggest systemic infection.  No significant metabolic derangements.  UA not consistent with UTI.  Given recent procedure and reported elevated heart rate concern for possible PE.  However D-dimer is negative.  She appears to have remained in sinus rhythm on telemetry while in the emergency department under observation.  Chest x-ray negative for pneumonia, pneumothorax.  Patient stable for discharge at this time.  Strict return precautions given.  Amount and/or Complexity of Data Reviewed Labs: ordered. Radiology: ordered.          Final Clinical Impression(s) / ED Diagnoses Final diagnoses:  Palpitations    Rx / DC Orders ED Discharge Orders     None         Coral Spikes, DO 02/17/23 2142

## 2023-02-17 NOTE — ED Notes (Signed)
RN to room. Pt's husband at bedside.

## 2023-02-17 NOTE — ED Notes (Addendum)
RN to bedside. Pt's husband is at bedside. Side rails x1. Bed locked and in lowest position. Pt on cardiac monitor, BP cuff, and pulse ox. Pt at this moment is not in visible distress. A&O x 4. Denies pain, but verbalized complaints of dizziness. Provider notified and awaiting response.

## 2023-02-17 NOTE — ED Triage Notes (Signed)
Patient is 1 week Post op cyst removal. She presents form the MD office due to palpations and dizziness.

## 2023-02-18 LAB — CERVICOVAGINAL ANCILLARY ONLY
Bacterial Vaginitis (gardnerella): NEGATIVE
Candida Glabrata: NEGATIVE
Candida Vaginitis: NEGATIVE
Comment: NEGATIVE
Comment: NEGATIVE
Comment: NEGATIVE

## 2023-02-19 ENCOUNTER — Emergency Department (HOSPITAL_BASED_OUTPATIENT_CLINIC_OR_DEPARTMENT_OTHER)
Admission: EM | Admit: 2023-02-19 | Discharge: 2023-02-19 | Disposition: A | Discharge status: Home / Self Care | Payer: 59 | Attending: Emergency Medicine | Admitting: Emergency Medicine

## 2023-02-19 ENCOUNTER — Other Ambulatory Visit: Payer: Self-pay

## 2023-02-19 ENCOUNTER — Encounter (HOSPITAL_BASED_OUTPATIENT_CLINIC_OR_DEPARTMENT_OTHER): Payer: Self-pay | Admitting: Emergency Medicine

## 2023-02-19 ENCOUNTER — Emergency Department (HOSPITAL_BASED_OUTPATIENT_CLINIC_OR_DEPARTMENT_OTHER): Payer: 59

## 2023-02-19 DIAGNOSIS — R002 Palpitations: Secondary | ICD-10-CM | POA: Insufficient documentation

## 2023-02-19 DIAGNOSIS — R0602 Shortness of breath: Secondary | ICD-10-CM | POA: Diagnosis not present

## 2023-02-19 DIAGNOSIS — R42 Dizziness and giddiness: Secondary | ICD-10-CM | POA: Diagnosis present

## 2023-02-19 LAB — CBC WITH DIFFERENTIAL/PLATELET
Abs Immature Granulocytes: 0.11 10*3/uL — ABNORMAL HIGH (ref 0.00–0.07)
Basophils Absolute: 0.1 10*3/uL (ref 0.0–0.1)
Basophils Relative: 1 %
Eosinophils Absolute: 0.1 10*3/uL (ref 0.0–0.5)
Eosinophils Relative: 1 %
HCT: 43.7 % (ref 36.0–46.0)
Hemoglobin: 14.8 g/dL (ref 12.0–15.0)
Immature Granulocytes: 1 %
Lymphocytes Relative: 28 %
Lymphs Abs: 3.4 10*3/uL (ref 0.7–4.0)
MCH: 30.2 pg (ref 26.0–34.0)
MCHC: 33.9 g/dL (ref 30.0–36.0)
MCV: 89.2 fL (ref 80.0–100.0)
Monocytes Absolute: 1.1 10*3/uL — ABNORMAL HIGH (ref 0.1–1.0)
Monocytes Relative: 9 %
Neutro Abs: 7.4 10*3/uL (ref 1.7–7.7)
Neutrophils Relative %: 60 %
Platelets: 375 10*3/uL (ref 150–400)
RBC: 4.9 MIL/uL (ref 3.87–5.11)
RDW: 12.5 % (ref 11.5–15.5)
WBC: 12.1 10*3/uL — ABNORMAL HIGH (ref 4.0–10.5)
nRBC: 0 % (ref 0.0–0.2)

## 2023-02-19 LAB — BASIC METABOLIC PANEL
Anion gap: 14 (ref 5–15)
BUN: 15 mg/dL (ref 6–20)
CO2: 20 mmol/L — ABNORMAL LOW (ref 22–32)
Calcium: 10.5 mg/dL — ABNORMAL HIGH (ref 8.9–10.3)
Chloride: 103 mmol/L (ref 98–111)
Creatinine, Ser: 0.84 mg/dL (ref 0.44–1.00)
GFR, Estimated: >60 mL/min (ref >60)
Glucose, Bld: 116 mg/dL — ABNORMAL HIGH (ref 70–99)
Potassium: 3.5 mmol/L (ref 3.5–5.1)
Sodium: 137 mmol/L (ref 135–145)

## 2023-02-19 MED ORDER — MECLIZINE HCL 25 MG PO TABS: 25.0000 mg | ORAL_TABLET | Freq: Three times a day (TID) | ORAL | 0 refills | Status: AC | PRN

## 2023-02-19 MED ORDER — LACTATED RINGERS IV BOLUS
1000.0000 mL | Freq: Once | INTRAVENOUS | Status: AC
  Administered 2023-02-19: 1000 mL via INTRAVENOUS

## 2023-02-19 MED ORDER — MECLIZINE HCL 25 MG PO TABS
25.0000 mg | ORAL_TABLET | Freq: Once | ORAL | Status: AC
  Administered 2023-02-19: 25 mg via ORAL
  Filled 2023-02-19: qty 1

## 2023-02-19 NOTE — ED Provider Notes (Signed)
[place] EMERGENCY DEPARTMENT AT [facility] Provider Note   CSN: [id number] Arrival date & time: 02/19/23  6301     History  Chief Complaint  Patient presents with   Shortness of Breath   Palpitations    Stacy Ochoa is a 47 y.o. female.  Patient is a 47 year old female with a history of urethral cyst with recent surgery 1 week ago, migraines who is presenting today with a recurrent episode of dizziness, nausea and shortness of breath.  Patient reports the first episode she got was on Sunday and went following up with her PCP office on Monday she reports having an episode where her heart rate increased she felt dizzy and was sent to the emergency room.  Symptoms did improve spontaneously and she had a workup that ruled out cardiac causes and there was low suspicion for blood clot at that time.  She was discharged home and reports yesterday seem to be an okay day but when she woke up this morning she already was not feeling great was feeling a little bit lightheaded but after getting to work she became severely dizzy where things were moving she felt lightheaded and nauseated and then started feeling short of breath.  She feels better with laying down and is much worse if she tries to stand or walk.  She denies any recent fevers, abdominal pain or vomiting.  She reports the surgical area has been healing well and she continues to feel like it is improving.  She has had no issues with urinating.  She started no new medications.  She has been eating and drinking well.  She does not have a headache today denies any ringing in her ears or sound changes.  No recent URI symptoms.  The history is provided by the patient.  Shortness of Breath Palpitations Associated symptoms: shortness of breath        Home Medications Prior to Admission medications   Medication Sig Start Date End Date Taking? Authorizing Provider  meclizine (ANTIVERT) 25 MG tablet Take 1 tablet (25 mg total) by  mouth 3 (three) times daily as needed for dizziness. 02/19/23  Yes Gwyneth Sprout, MD  acetaminophen (TYLENOL) 500 MG tablet Take 1 tablet (500 mg total) by mouth every 6 (six) hours as needed (pain). 02/10/23   Marguerita Beards, MD  albuterol (VENTOLIN HFA) 108 (90 Base) MCG/ACT inhaler Inhale 1-2 puffs into the lungs every 6 (six) hours as needed for wheezing or shortness of breath. Patient taking differently: Inhale 1-2 puffs into the lungs as needed for wheezing or shortness of breath. 12/18/21   Glyn Ade, MD  Ascorbic Acid (VITAMIN C PO) Take by mouth.    [provider]  Atogepant (QULIPTA) 60 MG TABS TAKE 1 TABLET BY MOUTH DAILY Patient taking differently: Take 1 tablet by mouth at bedtime. 01/09/23   Anson Fret, MD  atorvastatin (LIPITOR) 20 MG tablet Take 20 mg by mouth at bedtime.    [provider]  Cholecalciferol (VITAMIN D-3 PO) Take 1 capsule by mouth daily.    [provider]  Cyanocobalamin (B-12 PO) Take by mouth.    [provider]  cyclobenzaprine (FLEXERIL) 5 MG tablet Take 1-2 tablets (5-10 mg total) by mouth every 8 (eight) hours as needed for muscle spasms. 10/29/22   Anson Fret, MD  fluconazole (DIFLUCAN) 150 MG tablet Take 1 tablet (150 mg total) by mouth every other day as needed. 02/03/23   Letta Kocher, CNM  fluticasone (  FLONASE) 50 MCG/ACT nasal spray Place 2 sprays into both nostrils 2 (two) times daily.  09/02/17   [provider]  ipratropium-albuterol (DUONEB) 0.5-2.5 (3) MG/3ML SOLN Take 3 mLs by nebulization every 4 (four) hours as needed for up to 20 days. 12/18/21 10/29/22  Glyn Ade, MD  methylPREDNISolone (MEDROL DOSEPAK) 4 MG TBPK tablet Take pills daily all together with food. Take the first dose (6 pills) as soon as possible. Take the rest each morning. For 6 days total 6-5-4-3-2-1. Patient taking differently: as needed. Take pills daily all together with food. Take the first dose (6  pills) as soon as possible. Take the rest each morning. For 6 days total 6-5-4-3-2-1.  For migraines. 10/29/22   Anson Fret, MD  NURTEC 75 MG TBDP TAKE 1 TABLET BY MOUTH DAILY AS NEEDED 01/16/23   Anson Fret, MD  ondansetron (ZOFRAN-ODT) 4 MG disintegrating tablet Take 1-2 tablets (4-8 mg total) by mouth every 8 (eight) hours as needed. 10/29/22   Anson Fret, MD  oxyCODONE (OXY IR/ROXICODONE) 5 MG immediate release tablet Take 1 tablet (5 mg total) by mouth every 4 (four) hours as needed for severe pain (pain score 7-10). 02/10/23   Marguerita Beards, MD      Allergies    Aspirin, Nsaids, and Shellfish allergy    Review of Systems   Review of Systems  Respiratory:  Positive for shortness of breath.   Cardiovascular:  Positive for palpitations.    Physical Exam Updated Vital Signs BP (!) 126/95   Pulse 82   Temp 97.8 F (36.6 C) (Oral)   Resp 17   Ht 5\' 1"  (1.549 m)   Wt 88.1 kg   LMP 11/06/2017 (Exact Date)   SpO2 100%   BMI 36.69 kg/m  Physical Exam Vitals and nursing note reviewed.  Constitutional:      General: She is not in acute distress.    Appearance: She is well-developed.  HENT:     Head: Normocephalic and atraumatic.     Right Ear: Tympanic membrane normal.     Left Ear: Tympanic membrane normal.  Eyes:     Conjunctiva/sclera: Conjunctivae normal.     Pupils: Pupils are equal, round, and reactive to light.  Cardiovascular:     Rate and Rhythm: Normal rate and regular rhythm.     Heart sounds: No murmur heard. Pulmonary:     Effort: Pulmonary effort is normal. No respiratory distress.     Breath sounds: Normal breath sounds. No wheezing or rales.  Abdominal:     General: There is no distension.     Palpations: Abdomen is soft.     Tenderness: There is no abdominal tenderness. There is no guarding or rebound.  Musculoskeletal:        General: No tenderness. Normal range of motion.     Cervical back: Normal range of motion and neck supple.  No tenderness.  Skin:    General: Skin is warm and dry.     Findings: No erythema or rash.  Neurological:     Mental Status: She is alert and oriented to person, place, and time. Mental status is at baseline.     Cranial Nerves: No dysarthria or facial asymmetry.     Sensory: Sensation is intact. No sensory deficit.     Motor: Motor function is intact. No weakness.     Gait: Gait is intact.     Comments: Several beats of nystagmus when looking to the right.  Normal EOM  Psychiatric:        Behavior: Behavior normal.     ED Results / Procedures / Treatments   Labs (all labs ordered are listed, but only abnormal results are displayed) Labs Reviewed  CBC WITH DIFFERENTIAL/PLATELET - Abnormal; Notable for the following components:      Result Value   WBC 12.1 (*)    Monocytes Absolute 1.1 (*)    Abs Immature Granulocytes 0.11 (*)    All other components within normal limits  BASIC METABOLIC PANEL - Abnormal; Notable for the following components:   CO2 20 (*)    Glucose, Bld 116 (*)    Calcium 10.5 (*)    All other components within normal limits  URINE CULTURE    EKG EKG Interpretation Date/Time:  Wednesday February 19 2023 09:28:05 EDT Ventricular Rate:  93 PR Interval:  132 QRS Duration:  92 QT Interval:  422 QTC Calculation: 525 R Axis:   71  Text Interpretation: Sinus rhythm Minimal ST depression, inferior leads new  Prolonged QT interval Confirmed by Gwyneth Sprout ([id number]) on 02/19/2023 9:42:45 AM  Radiology CT Head Wo Contrast  Result Date: 02/19/2023 CLINICAL DATA:  Provided history: Vertigo, peripheral. EXAM: CT HEAD WITHOUT CONTRAST TECHNIQUE: Contiguous axial images were obtained from the base of the skull through the vertex without intravenous contrast. RADIATION DOSE REDUCTION: This exam was performed according to the departmental dose-optimization program which includes automated exposure control, adjustment of the mA and/or kV according to patient size  and/or use of iterative reconstruction technique. COMPARISON:  None. FINDINGS: Brain: No age advanced or lobar predominant parenchymal atrophy. There is no acute intracranial hemorrhage. No demarcated cortical infarct. No extra-axial fluid collection. No evidence of an intracranial mass. No midline shift. Vascular: No hyperdense vessel.  Atherosclerotic calcifications. Skull: No calvarial fracture or aggressive osseous lesion. Sinuses/Orbits: No mass or acute finding within the imaged orbits. No significant paranasal sinus disease at the imaged levels. IMPRESSION: No evidence of an acute intracranial abnormality. Electronically Signed   By: Jackey Loge D.O.   On: 02/19/2023 11:29   DG Chest Portable 1 View  Result Date: 02/17/2023 CLINICAL DATA:  Near syncope EXAM: PORTABLE CHEST 1 VIEW COMPARISON:  12/21/2021 FINDINGS: Normal heart size.The cardiomediastinal contours are normal. The lungs are clear. Pulmonary vasculature is normal. No consolidation, pleural effusion, or pneumothorax. No acute osseous abnormalities are seen. IMPRESSION: No acute chest findings. Electronically Signed   By: Narda Rutherford M.D.   On: 02/17/2023 21:16    Procedures Procedures    Medications Ordered in ED Medications  meclizine (ANTIVERT) tablet 25 mg (25 mg Oral Given 02/19/23 1011)  lactated ringers bolus 1,000 mL (0 mLs Intravenous Stopped 02/19/23 1133)    ED Course/ Medical Decision Making/ A&P                                 Medical Decision Making Amount and/or Complexity of Data Reviewed External Data Reviewed: notes. Labs: ordered. Decision-making details documented in ED Course. Radiology: ordered and independent interpretation performed. Decision-making details documented in ED Course. ECG/medicine tests: ordered and independent interpretation performed. Decision-making details documented in ED Course.  Risk Prescription drug management.   Pt with sx most consistent with peripheral vertigo.   No systemic or infectious sx even though pt did have recent urethral surgery she has had no issues and seems to be healing well.  Normal neuro exam without weakness, ataxia  or cerebellar findings on exam.  Normal vision.  Sx are reproducible with movement of the head, looking to the right and attempting to walk.  No hx of Stroke and low likelihood.  No risk factors and normal VS. Will treat for peripheral vertigo and re-eval.  Patient was evaluated 2 days ago due to a similar complaint but was also complaining of shortness of breath at the time.  EKG, blood work and chest x-ray at that time were negative.  Patient had no evidence of dysrhythmia while hospitalized.  Low suspicion for PE at this time as patient's D-dimer 2 days ago was negative and patient did not have prolonged hospital stay it was a outpatient surgery and she has no other risk factors for clot.  She has no lung disease, is not a smoker and does not use inhalers at home.  She has not had any URI symptoms.  I independently interpreted patient's labs and today she has a mild white count of 12 but otherwise normal CBC and BMP.  No evidence of anemia at this time.  I independently interpreted patient's EKG which shows a mild prolonged QT but no other acute changes from 2 days ago no evidence of ST findings concerning for ACS or dysrhythmias.  I have independently visualized and interpreted pt's images today. Head CT today is negative for bleed or masses.  Patient was given meclizine and on reevaluation she is feeling much better.  Blood pressure remains normal at 128/55.  After medicine patient was able to get out of bed and walk and feels much better.  She was given a prescription for meclizine.  She does have a history of migraines but denies any headache at this time.  Was given return precautions but at this time does not warrant further imaging.          Final Clinical Impression(s) / ED Diagnoses Final diagnoses:  Vertigo    Rx /  DC Orders ED Discharge Orders          Ordered    meclizine (ANTIVERT) 25 MG tablet  3 times daily PRN        02/19/23 1220              Gwyneth Sprout, MD 02/19/23 1305

## 2023-02-19 NOTE — ED Triage Notes (Signed)
Pt arrived POV from work c/o dizziness and SOB that has been going on since Sunday. Pt states she has also had intermittent palpitations as well but denies at present. Pt becomes tachypneic but non labored when talking and states s/s worsen when talking and improve when laying down. Lung sounds clear and equal. Pt was seen 2 days ago for same. S/S started after procedure to have urethral cyst removed approx 1.5 wks ago.

## 2023-02-19 NOTE — ED Notes (Signed)
Discharge instructions, follow up care, and prescriptions reviewed and explained, pt verbalized understanding and had no further questions on d/c. Pt caox4, ambulatory, NAD on d/c.  

## 2023-02-19 NOTE — Discharge Instructions (Signed)
Imaging today was reassuring.  Lab work still looked okay.  The white blood cell count was a little bit elevated today but otherwise everything else was normal.  A urine culture was sent so if there is anything abnormal they will call you.  You only need to take the medication if you are feeling dizzy.  Avoid changing position of your head quickly.  If you start having fever, new pain with urinating or any issues make sure you talk with your surgeons but return to the emergency room if you are having any difficulty walking, severe headache, persistent vomiting or dizziness becomes to the point that you cannot get up or have numbness or weakness on one side of your body.

## 2023-02-21 LAB — URINE CULTURE
Culture: NO GROWTH
Special Requests: NORMAL

## 2023-02-24 ENCOUNTER — Encounter: Payer: Self-pay | Admitting: Obstetrics

## 2023-02-24 ENCOUNTER — Ambulatory Visit (INDEPENDENT_AMBULATORY_CARE_PROVIDER_SITE_OTHER): Payer: 59 | Admitting: Obstetrics

## 2023-02-24 VITALS — BP 132/91 | HR 96

## 2023-02-24 DIAGNOSIS — N393 Stress incontinence (female) (male): Secondary | ICD-10-CM | POA: Diagnosis not present

## 2023-02-24 DIAGNOSIS — R42 Dizziness and giddiness: Secondary | ICD-10-CM | POA: Diagnosis not present

## 2023-02-24 DIAGNOSIS — N340 Urethral abscess: Secondary | ICD-10-CM | POA: Diagnosis not present

## 2023-02-24 NOTE — Assessment & Plan Note (Signed)
-   Urine culture negative 02/19/23 and denies UTI symptoms - For treatment of stress urinary incontinence,  non-surgical options include expectant management, weight loss, physical therapy, as well as a pessary.  Surgical options include a midurethral sling, Burch urethropexy, and transurethral injection of a bulking agent. - provided and reviewed handouts for periurethral bulking and midurethral sling, encouraged pt to review options and reassess at 6 weeks follow-up.  - declined pessary due to prior discomfort with tampon use

## 2023-02-24 NOTE — Assessment & Plan Note (Addendum)
-   evaluated in ED with negative D-dimer and CXR - symptoms resolved and pt denies Meclizine use - tachycardia resolved with repeat vitals prior to discharge - pt pending cardiology evaluation for tachycardia and continues migraine medications

## 2023-02-24 NOTE — Assessment & Plan Note (Addendum)
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 Encouraged pt to return for repeat exam prior to resumption of intercourse and call office if she experiences any change in urinary or vaginal symptoms. - return to work note provided to pt

## 2023-02-24 NOTE — Progress Notes (Signed)
[facility]  Date of Visit: 02/24/2023  History of Present Illness: Ms. Stacy Ochoa is a 47 y.o. female who returns for a ED follow-up visit.   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 Denies dysuria, hematuria.   Postoperative course was uncomplicated.   Denies taking Meclizine since discharge from the ED and reports feeling much better.  Denies symptoms of palpitations or headaches or dizziness today.  Reports episode of urinary leakage x 1 with sneezing on the way to work, present prior to surgery managed with panty liner use daily since 2nd vaginal delivery in 2003. Reports 3 leaks/week. Offered midurethral sling by OBGYN in [place] pt declined at that time due to minimal bother Reports small drop of urine postvoid when she stands, present prior to surgery.  Denies dysuria, hematuria.  Reports vaginal itching.   02/17/23 ED urgent evaluation due to significant dyspnea with episodes of tachycardia to HR 133 while standing, history of migraine and vertigo however patient reports well controlled. Episode of difficulty recalling where her husband worked and left arm tingling with dyspnea. Patient opted for transport to ED by her friend.  - negative D-dimer 0.31 and CXR, HR 97 and symptoms improved. Hgb 14. Negative Nuswab - EKG Interpretation Date/Time:                  Monday February 17 2023 15:22:42 EDT Ventricular Rate:         99 PR Interval:                 136 QRS Duration:             90 QT Interval:                 359 QTC Calculation:461 R Axis:                         86   Text Interpretation:Sinus rhythm Right atrial enlargement Abnormal R-wave progression, early transition Minimal ST depression, diffuse leads Confirmed by Estanislado Pandy [phone]) on 02/17/2023 6:11:10 PM  Office visit on 02/17/23: - POD#7 felt warm and dizzy starting on POD#5 with Tmax 100. Denies syncope.  Reported palpitations, fatigue and  minimal yellow vaginal discharge only when she wiped, denied bleeding.  Intermittent 3/10 sharp pain when she sits down. Denies pain medications.  Denies N/V, tolerating regular diet.  Resumed migraine and HLD medications. Denies sick contact, history of CAD, family history of MI or VTEs. History of well controlled asthma.  Reported normal bowel movements, denies diarrhea.  History of SOB in 2023 after COVID and history of asthma managed by Dr. Wardell Heath. Patient reports well controlled with PRN meds only. History of vertigo  Returned to ED 02/19/23 with return of lightheadedness, dizziness and nausea with shortness of breath while she presented at work.  Symptoms improved with laying down, and worsens with ambulation or activity.  Nystagmus noted on exam and reproducible symptoms with movement of head. Rx Meclizine. Negative UA and urine culture. CBC with leukocytosis 12.1  Repeated EKG Interpretation Date/Time:                  Wednesday February 19 2023 09:28:05 EDT Ventricular Rate:         93 PR Interval:                 132 QRS Duration:  92 QT Interval:                 422 QTC Calculation:525 R Axis:                         71   Text Interpretation:      Sinus rhythm Minimal ST depression, inferior leads new.   Prolonged QT interval Confirmed by Gwyneth Sprout ([id number]) on 02/19/2023 9:42:45 AM  Negative head CT  Pathology results: epidermal inclusion cyst  Medications: She has a current medication list which includes the following prescription(s): acetaminophen, albuterol, ascorbic acid, qulipta, atorvastatin, cholecalciferol, cyanocobalamin, cyclobenzaprine, fluconazole, fluticasone, meclizine, methylprednisolone, nurtec, ondansetron, oxycodone, and ipratropium-albuterol.   Allergies: Patient is allergic to aspirin, nsaids, and shellfish allergy.   Physical Exam: Orthostatic vitals Today's Vitals   02/24/23 1022 02/24/23 1107  BP: (!) 134/92 (!) 132/91  Pulse:  (!) 101 96    Vaginal Incisions: healing well, no significant drainage, no dehiscence, no significant erythema.  Pelvic Examination: Vagina: Incisions healing well. Sutures are present at incision line with most proximal suture loosened with white discharge. Granulation tissue resolved. No tenderness along the anterior or posterior vagina, no purulent discharge expressed.  Positive CST  ---------------------------------------------------------  Assessment and Plan:  1. Vertigo   2. SUI (stress urinary incontinence, female)   3. Skene's gland abscess     - normal appearing vaginal epithelium with normal appearing incision and intact sutures, no erythema or discharge to suggest infection. Vaginal swab pending to r/o infectious etiology, however unlikely based on exam. No CVA tenderness and denies UTI symptoms, likely contaminated UA if collected due to recent instrumentation and catheterization for surgery. - Pathology results were reviewed with the patient today and she verbalized understanding that the results were benign.   Vertigo Assessment & Plan: - evaluated in ED with negative D-dimer and CXR - symptoms resolved and pt denies Meclizine use - tachycardia resolved with repeat vitals prior to discharge - pt pending cardiology evaluation for tachycardia and continues migraine medications   SUI (stress urinary incontinence, female) Assessment & Plan: - Urine culture negative 02/19/23 and denies UTI symptoms - For treatment of stress urinary incontinence,  non-surgical options include expectant management, weight loss, physical therapy, as well as a pessary.  Surgical options include a midurethral sling, Burch urethropexy, and transurethral injection of a bulking agent. - provided and reviewed handouts for periurethral bulking and midurethral sling, encouraged pt to review options and reassess at 6 weeks follow-up.  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 Encouraged pt to return for repeat exam prior to resumption of intercourse and call office if she experiences any change in urinary or vaginal symptoms.   All questions answered.   Time spent: I spent 34 minutes dedicated to the care of this patient on the date of this encounter to include pre-visit review of records, face-to-face time with the patient discussing stress urinary incontinence and post visit documentation.    Return in about 1 month (around 03/26/2023) for postop visit with Dr. Florian Buff.

## 2023-02-24 NOTE — Patient Instructions (Addendum)
For treatment of stress urinary incontinence, which is leakage with physical activity/movement/strainging/coughing, we discussed expectant management versus nonsurgical options versus surgery. Nonsurgical options include weight loss, physical therapy, as well as a pessary.  Surgical options include a midurethral sling, which is a synthetic mesh sling that acts like a hammock under the urethra to prevent leakage of urine, a Burch urethropexy, and transurethral injection of a bulking agent.   - start Kegel exercises  She prefers an office procedure with urethral bulking (Bulkamid). We discussed success rate of approximately 70-80% and possible need for second injection. We reviewed that this is not a permanent procedure and the Bulkamid does dissolve over time. Risks reviewed including injury to bladder or urethra, UTI, urinary retention and hematuria.

## 2023-03-26 ENCOUNTER — Ambulatory Visit (INDEPENDENT_AMBULATORY_CARE_PROVIDER_SITE_OTHER): Payer: 59 | Admitting: Obstetrics and Gynecology

## 2023-03-26 ENCOUNTER — Encounter: Payer: Self-pay | Admitting: Obstetrics and Gynecology

## 2023-03-26 VITALS — BP 120/83 | HR 92

## 2023-03-26 DIAGNOSIS — Z48816 Encounter for surgical aftercare following surgery on the genitourinary system: Secondary | ICD-10-CM

## 2023-03-26 DIAGNOSIS — Z9889 Other specified postprocedural states: Secondary | ICD-10-CM

## 2023-03-26 NOTE — Progress Notes (Signed)
[facility]  Date of Visit: 03/26/2023  History of Present Illness: Stacy Ochoa is a 47 y.o. female scheduled today for a post-operative visit.   Surgery: s/p s/p Skene's gland cyst excision on 02/10/23  Post op course complicated by vertigo and tachycardia.   Pathology results: Epidermal inclusion cyst   Medications: She has a current medication list which includes the following prescription(s): acetaminophen, albuterol, ascorbic acid, qulipta, atorvastatin, cholecalciferol, cyanocobalamin, cyclobenzaprine, fluconazole, fluticasone, meclizine, methylprednisolone, nurtec, ondansetron, oxycodone, and ipratropium-albuterol.   Allergies: Patient is allergic to aspirin, nsaids, and shellfish allergy.   Physical Exam: BP 120/83   Pulse 92   LMP 11/06/2017 (Exact Date)    Pelvic Examination: Normal appearing urethra. Site of cyst incision appears patent/ open but well healed. Area cauterized with silver nitrate   ---------------------------------------------------------  Assessment and Plan:  1. Post-operative state     - Small pocket at site of cyst incision. Treated with silver nitrate today in an attempt to get it to close further which will prevent any accumulation of fluid.  - Ok to return to normal activity/ intercourse - Has f/u with cardiology regarding palpitations and vertigo.   Return 1 month  Marguerita Beards, MD

## 2023-03-28 ENCOUNTER — Encounter: Payer: 59 | Admitting: Obstetrics and Gynecology

## 2023-04-04 ENCOUNTER — Ambulatory Visit: Payer: 59 | Admitting: Cardiology

## 2023-04-06 NOTE — Progress Notes (Unsigned)
  Cardiology Office Note:  .   Date:  04/07/2023  ID:  Stacy Ochoa, DOB Oct 27, 1975, MRN [id number] PCP: Stevphen Rochester, MD  [facility] Providers Cardiologist:  None { History of Present Illness: .   Stacy Ochoa is a 47 y.o. female with history of HLD who presents for the evaluation of palpitations at the request of Stevphen Rochester, MD. Seen in ER for palpitations after [facility] gland cyst removal. Work-up in ER 02/17/2023 negative.   History of Present Illness   The patient, a 47 year old female, presents for evaluation of palpitations that began post-surgery in late October. She describes feeling dizzy and experiencing palpitations for about a week following the procedure. The patient reports that these symptoms have largely resolved, with only one or two brief episodes of dizziness since, and no further palpitations. She underwent general anesthesia for the surgery, which was to remove an abscess. The patient has a history high cholesterol, for which she recently started taking atorvastatin. She denies any history of hypertension, diabetes, heart attack, or stroke. The patient has a family history of heart disease on her mother's side. She does not smoke or consume alcohol. The patient works for the [employer] and has been largely sedentary over the past year due to recurrent infections related to the abscess. Family history of heart disease in maternal Grandfather. TSH and labs within limits. No CP or SOB. CV exam normal.         ROS: All other ROS reviewed and negative. Pertinent positives noted in the HPI.     Studies Reviewed: Marland Kitchen       Physical Exam:   VS:  BP 118/85 (BP Location: Left Arm, Patient Position: Sitting, Cuff Size: Normal)   Pulse 88   Ht 5\' 5"  (1.651 m)   Wt 193 lb 3.2 oz (87.6 kg)   LMP 11/06/2017 (Exact Date)   SpO2 97%   BMI 32.15 kg/m    Wt Readings from Last 3 Encounters:  04/07/23 193 lb 3.2 oz (87.6 kg)  02/19/23 194 lb 3.2 oz (88.1 kg)   02/17/23 194 lb 3.2 oz (88.1 kg)    GEN: Well nourished, well developed in no acute distress NECK: No JVD; No carotid bruits CARDIAC: RRR, no murmurs, rubs, gallops RESPIRATORY:  Clear to auscultation without rales, wheezing or rhonchi  ABDOMEN: Soft, non-tender, non-distended EXTREMITIES:  No edema; No deformity  ASSESSMENT AND PLAN: .   Assessment and Plan    Postoperative Palpitations and Dizziness Episodes of palpitations and dizziness occurred post-surgery, but have since resolved. No further episodes in the past six weeks. ER workup was negative. -No further testing recommended at this time. -Return for evaluation if symptoms recur.  Hyperlipidemia High cholesterol noted, patient recently started on Atorvastatin. -Continue Atorvastatin as prescribed.              Follow-up: Return if symptoms worsen or fail to improve.  Signed, Lenna Gilford. Flora Lipps, MD, [facility]  [facility]  [address] [place], [place] [address] [phone]  4:31 PM

## 2023-04-07 ENCOUNTER — Encounter: Payer: Self-pay | Admitting: Cardiovascular Disease

## 2023-04-07 ENCOUNTER — Ambulatory Visit: Payer: 59 | Attending: Cardiovascular Disease | Admitting: Cardiovascular Disease

## 2023-04-07 VITALS — BP 118/85 | HR 88 | Ht 65.0 in | Wt 193.2 lb

## 2023-04-07 DIAGNOSIS — R002 Palpitations: Secondary | ICD-10-CM | POA: Diagnosis not present

## 2023-04-07 DIAGNOSIS — R42 Dizziness and giddiness: Secondary | ICD-10-CM

## 2023-04-07 NOTE — Patient Instructions (Signed)
 Medication Instructions:  Your physician recommends that you continue on your current medications as directed. Please refer to the Current Medication list given to you today.    *If you need a refill on your cardiac medications before your next appointment, please call your pharmacy*   Lab Work: None    If you have labs (blood work) drawn today and your tests are completely normal, you will receive your results only by: MyChart Message (if you have MyChart) OR A paper copy in the mail If you have any lab test that is abnormal or we need to change your treatment, we will call you to review the results.   Testing/Procedures: None    Follow-Up: At [facility], you and your health needs are our priority.  As part of our continuing mission to provide you with exceptional heart care, we have created designated Provider Care Teams.  These Care Teams include your primary Cardiologist (physician) and Advanced Practice Providers (APPs -  Physician Assistants and Nurse Practitioners) who all work together to provide you with the care you need, when you need it.  We recommend signing up for the patient portal called "MyChart".  Sign up information is provided on this After Visit Summary.  MyChart is used to connect with patients for Virtual Visits (Telemedicine).  Patients are able to view lab/test results, encounter notes, upcoming appointments, etc.  Non-urgent messages can be sent to your provider as well.   To learn more about what you can do with MyChart, go to [email or web address].    Your next appointment:   Follow up as needed    Provider:   Lennie Odor, MD   Other Instructions

## 2023-04-30 ENCOUNTER — Ambulatory Visit: Payer: 59 | Admitting: Obstetrics and Gynecology

## 2023-04-30 ENCOUNTER — Encounter: Payer: Self-pay | Admitting: Obstetrics and Gynecology

## 2023-04-30 VITALS — BP 134/99 | HR 97

## 2023-04-30 DIAGNOSIS — Z48816 Encounter for surgical aftercare following surgery on the genitourinary system: Secondary | ICD-10-CM

## 2023-04-30 DIAGNOSIS — Z9889 Other specified postprocedural states: Secondary | ICD-10-CM

## 2023-04-30 NOTE — Progress Notes (Signed)
 [facility]  Date of Visit: 04/30/2023  History of Present Illness: Ms. Slovacek is a 48 y.o. female scheduled today for a post-operative visit.   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 It was treated with silver nitrate in an attempt to close the space. Pt reports she can still see the hole but not having any bothersome symptoms or discharge.   Pt reports she has noticed increased SUI symptoms. She is interested in a sling, but prefers to wait until later in the year for surgery.   Pathology results: Epidermal inclusion cyst   Medications: She has a current medication list which includes the following prescription(s): acetaminophen , albuterol , ascorbic acid, qulipta , atorvastatin, cholecalciferol, cyanocobalamin , cyclobenzaprine , fluconazole , fluticasone, ipratropium-albuterol , meclizine , methylprednisolone , nurtec, ondansetron , and oxycodone .   Allergies: Patient is allergic to aspirin, nsaids, and shellfish allergy.   Physical Exam: BP (!) 134/99   Pulse 97   LMP 11/06/2017 (Exact Date)    Pelvic Examination: Normal appearing urethra. Site of cyst excision on right appears patent/ open but well healed.    ---------------------------------------------------------  Assessment and Plan:  1. Post-operative state     - Small pocket at site of cyst excision. Not causing any bothersome symptoms so will monitor.  - For SUI symptoms, will have her return in August for a follow up. Can do simple CMG at that time and plan for surgery.     Rosaline LOISE Caper, MD

## 2023-06-25 ENCOUNTER — Ambulatory Visit: Admitting: Obstetrics and Gynecology

## 2023-06-25 ENCOUNTER — Encounter: Payer: Self-pay | Admitting: Obstetrics and Gynecology

## 2023-06-25 VITALS — BP 122/81 | HR 90

## 2023-06-25 DIAGNOSIS — N393 Stress incontinence (female) (male): Secondary | ICD-10-CM | POA: Diagnosis not present

## 2023-06-25 DIAGNOSIS — N8110 Cystocele, unspecified: Secondary | ICD-10-CM

## 2023-06-25 DIAGNOSIS — R350 Frequency of micturition: Secondary | ICD-10-CM | POA: Diagnosis not present

## 2023-06-25 LAB — POCT URINALYSIS DIPSTICK
Bilirubin, UA: NEGATIVE
Blood, UA: NEGATIVE
Glucose, UA: NEGATIVE
Ketones, UA: NEGATIVE
Leukocytes, UA: NEGATIVE
Nitrite, UA: NEGATIVE
Protein, UA: NEGATIVE
Spec Grav, UA: 1.02 (ref 1.010–1.025)
Urobilinogen, UA: 0.2 U/dL
pH, UA: 7 (ref 5.0–8.0)

## 2023-06-25 NOTE — Progress Notes (Signed)
 Stacy Ochoa Return Visit  SUBJECTIVE  History of Present Illness: Stacy Ochoa is a 48 y.o. female seen in follow-up for lower pelvic pain, concern about the area around urethra, and SUI.     Past Medical History: Patient  has a past medical history of Asthma, COVID-19 (11/2021), History of anemia (2019), Migraine with aura, PONV (postoperative nausea and vomiting), Skene's gland abscess (2024), and Wears glasses.   Past Surgical History: She  has a past surgical history that includes Breast reduction surgery (1994); Total laparoscopic hysterectomy with salpingectomy (Bilateral, 12/08/2017); Cystoscopy (N/A, 12/08/2017); Cystoscopy (2024); Urethral cyst removal (N/A, 02/10/2023); and Cystoscopy (N/A, 02/10/2023).   Medications: She has a current medication list which includes the following prescription(s): acetaminophen, albuterol, ascorbic acid, qulipta, atorvastatin, cholecalciferol, cyanocobalamin, cyclobenzaprine, fluconazole, fluticasone, meclizine, methylprednisolone, nurtec, ondansetron, oxycodone, and ipratropium-albuterol.   Allergies: Patient is allergic to aspirin, nsaids, and shellfish allergy.   Social History: Patient  reports that she has never smoked. She has never used smokeless tobacco. She reports current alcohol use. She reports that she does not use drugs.     OBJECTIVE    Lab Results  Component Value Date   COLORU yellow 06/25/2023   CLARITYU clear 06/25/2023   GLUCOSEUR Negative 06/25/2023   BILIRUBINUR negative 06/25/2023   KETONESU negative 06/25/2023   SPECGRAV 1.020 06/25/2023   RBCUR negative 06/25/2023   PHUR 7.0 06/25/2023   PROTEINUR Negative 06/25/2023   UROBILINOGEN 0.2 06/25/2023   LEUKOCYTESUR Negative 06/25/2023     Physical Exam: Vitals:   06/25/23 1453  BP: 122/81  Pulse: 90   Gen: No apparent distress, A&O x 3.  Detailed Urogynecologic Evaluation:  Abdominal palpation did not yield any obvious area of concern. No  noted bilateral CVA tenderness.   Internal vaginal exam does not show concerning vaginal discharge, bleeding, mass or other area of concern. Patient does appear to have anterior vaginal wall prolapse that is pushing to a -1 with cough and valsalva. I showed her this with a mirror.    ASSESSMENT AND PLAN    Stacy Ochoa is a 48 y.o. with:  1. Urinary frequency   2. Pelvic organ prolapse quantification stage 1 cystocele   3. SUI (stress urinary incontinence, female)    Urine not indicative of UTI or kidney stone.  Patient informed of prolapse. Educated her on pelvic floor dysfunction related to prolapse. We even tried a #3 dish pessary with knob that supported her and under the urethra and patient reports it was comfortable with standing but not comfortable sitting and she did not like the idea of a device in her. I also offered pelvic floor PT which she defers at this time.  We discussed that during her sling surgery she could also have prolapse repair. Patient has +CST on exams. She is going on a Trp in December for her 48th birthday celebration and reports she wants to be healed and in peak condition for this trip.   Patient to follow up with Dr. Florian Buff for surgery planning.    Selmer Dominion, NP

## 2023-06-25 NOTE — Patient Instructions (Signed)
 You can consider the option of a pessary with a knob to keep you from leaking and support the bladder wall.   Also consider pelvic floor PT.

## 2023-08-22 ENCOUNTER — Encounter: Payer: Self-pay | Admitting: Family

## 2023-08-22 ENCOUNTER — Other Ambulatory Visit (HOSPITAL_COMMUNITY): Payer: Self-pay

## 2023-08-22 ENCOUNTER — Telehealth: Payer: Self-pay

## 2023-08-22 NOTE — Telephone Encounter (Signed)
 Pharmacy Patient Advocate Encounter   Received notification from CoverMyMeds that prior authorization for Nurtec 75MG  dispersible tablets is required/requested.   Insurance verification completed.   The patient is insured through [facility] .   Per test claim: PA required; PA submitted to above mentioned insurance via CoverMyMeds Key/confirmation #/EOC [id number] Status is pending

## 2023-08-25 ENCOUNTER — Other Ambulatory Visit (HOSPITAL_COMMUNITY): Payer: Self-pay

## 2023-08-25 NOTE — Telephone Encounter (Signed)
 Pharmacy Patient Advocate Encounter  Received notification from [facility] that Prior Authorization for Nurtec 75MG  dispersible tablets has been APPROVED from 08/22/2023 to 08/21/2024. Ran test claim, Copay is $0. This test claim was processed through [facility]- copay amounts may vary at other pharmacies due to pharmacy/plan contracts, or as the patient moves through the different stages of their insurance plan.   PA #/Case ID/Reference #: PA Case ID #: [id number]

## 2023-09-03 ENCOUNTER — Ambulatory Visit: Admitting: Obstetrics and Gynecology

## 2023-09-30 ENCOUNTER — Other Ambulatory Visit: Payer: Self-pay | Admitting: Neurology

## 2023-10-14 ENCOUNTER — Encounter: Payer: Self-pay | Admitting: Neurology

## 2023-10-14 ENCOUNTER — Telehealth: Payer: Self-pay | Admitting: Neurology

## 2023-10-14 ENCOUNTER — Telehealth: Payer: 59 | Admitting: Neurology

## 2023-10-14 DIAGNOSIS — G43009 Migraine without aura, not intractable, without status migrainosus: Secondary | ICD-10-CM

## 2023-10-14 MED ORDER — QULIPTA 60 MG PO TABS: 1.0000 | ORAL_TABLET | Freq: Every day | ORAL | 11 refills | Status: AC

## 2023-10-14 MED ORDER — NURTEC 75 MG PO TBDP: 1.0000 | ORAL_TABLET | Freq: Every day | ORAL | 11 refills | Status: AC | PRN

## 2023-10-14 NOTE — Progress Notes (Signed)
 [facility]    Provider:  Dr Ines Requesting Provider: Gladystine Erminio CROME, MD Primary Care Provider:  Gladystine Erminio CROME, MD  CC:  migraines  Virtual Visit via Video Note  I connected with Stacy Ochoa on 10/14/23 at  1:00 PM EDT by a video enabled telemedicine application and verified that I am speaking with the correct person using two identifiers.  Location: Patient: home Provider: office   I discussed the limitations of evaluation and management by telemedicine and the availability of in person appointments. The patient expressed understanding and agreed to proceed.   Follow Up Instructions:    I discussed the assessment and treatment plan with the patient. The patient was provided an opportunity to ask questions and all were answered. The patient agreed with the plan and demonstrated an understanding of the instructions.   The patient was advised to call back or seek an in-person evaluation if the symptoms worsen or if the condition fails to improve as anticipated.  I provided 12 minutes of non-face-to-face time during this encounter.   Onetha KATHEE Ines, MD  10/14/2023: On qulipta and nurtec. Here for follow up.her qulipta and her nurtec are still doing well, everything has been really well. No other focal neurologic deficits, associated symptoms, inciting events or modifiable factors.   Prior to [place] she was having 12 migraine days a month that could last all day long and daily headache now has 5 migraine days a month and <10 total headache days a month. She is very happy with the qulipta, she has been on it for 2 years, has had great improvement, the residual migraines are milder and easier to treat and her rescue medication is nurtec and relpax .  Patient complains of symptoms per HPI as well as the following symptoms: none . Pertinent negatives and positives per HPI. All others negative   HPI 10/29/2022:  Stacy Ochoa is a 48 y.o. female here as  requested by Gladystine Erminio CROME, MD for migraines. has Migraines; Mild intermittent asthma with acute exacerbation; IDA (iron deficiency anemia); Vitamin D  deficiency; Acute respiratory failure (HCC); COVID-19 virus infection; Skene's gland abscess; Recurrent UTI; Fever; Left flank pain; SUI (stress urinary incontinence, female); and Vertigo on their problem list.  She has had headaches for years, they have improved on Qulipta, she has no aura, they are pulsating pounding throbbing behind the eyes sometimes in the neck, on Qulipta as she has 4-5 migraine days a month, she has photophobia, phonophobia, nausea, she does vomit, no other associated symptoms and the migraines can last 4 to 72 hours and be moderate to severe.  Prior to [place] she was having 12 migraine days a month that could last all day long and daily headache now has 5 migraine days a month and <10 total headache days a month. She is very happy with the qulipta, she has been on it for 2 years, has had great improvement, the residual migraines are milder and easier to treat and her rescue medication is nurtec and relpax . She reports a FHx of migraines.  She has not had brain imaging, migraines haven't changed in quality, not positional or exertional, no vision changes. In the last few weeks getting pain from neck to the right side of the head, probably occipital neuralgia. Soreness inbetween. She reports Morning headaches, daytime fatigue, snoring and we discussed sleep apnea and she agreed to.sleep evaluation. No other focal neurologic deficits, associated symptoms, inciting events or modifiable factors.   Reviewed notes, labs and imaging  from outside physicians, which showed:   Reviewed Dr. Carmelita note, meds tried > 2 months include:  Per headache clinic note: Current and past medications ANALGESICS: tylenol  #3, tylenol , ASA (anaphylaxis) ANTI-MIGRAINE: Imitrex  (moderate relief), Relpax  (significant relief), Nurtec (helpful), Ubrelvy  (not helpful) HEART/BP: atenolol DECONGESTANT/ANTIHISTAMINE: zyrtec, flonase, singulair , nasonex, allegra, benadryl, claritin, sudafed ANTI-NAUSEANT: zofran  4mg , reglan, phenergan , trimethobenzamide NSAIDS:  MUSCLE RELAXANTS: baclofen  (very helpful), flexeril  (helpful, but sedating) ANTI-CONVULSANTS: topamax  (cog dys at doses higher than 100mg  QD), Trokendi  XR STEROIDS: decadron , hydrocortisone, medrol , prednisone  SLEEPING PILLS/TRANQUILIZERS: ANTI-DEPRESSANTS: amitriptyline HERBAL:  FIBROMYALGIA:  HORMONAL: OTHER: Emgality (helpful), Aimovig, Ajovy ($$$), Nurtec QOD, Qulipta (helpful) PROCEDURES FOR HEADACHES:   Review of Systems: Patient complains of symptoms per HPI as well as the following symptoms none. Pertinent negatives and positives per HPI. All others negative.   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  Tobacco Use   Smoking status: Never   Smokeless tobacco: Never  Vaping Use   Vaping status: Never Used  Substance and Sexual Activity   Alcohol use: Yes    Comment: rarely   Drug use: No   Sexual activity: Yes    Birth control/protection: Surgical    Comment: vasectomy , hysterectomy  Other Topics Concern   Not on file  Social History Narrative   Not on file   Social Drivers of Health   Financial Resource Strain: Low Risk  (06/24/2023)   Received from [facility]   Overall Financial Resource Strain (CARDIA)    Difficulty of Paying Living Expenses: Not hard at all  Food Insecurity: No Food Insecurity (06/24/2023)   [facility] from [facility]   Hunger Vital Sign    Within the past 12 months, you worried that your food would run out before you got the money to buy more.: Never true    Within the past 12 months, the food you bought just didn't last  and you didn't have money to get more.: Never true  Transportation Needs: No Transportation Needs (06/24/2023)   Received from [facility] - [facility]    Lack of Transportation (Medical): No    Lack of Transportation (Non-Medical): No  Physical Activity: Unknown (11/25/2022)   Received from [facility]   Exercise Vital Sign    On average, how many days per week do you engage in moderate to strenuous exercise (like a brisk walk)?: 0 days    Minutes of Exercise per Session: Not on file  Recent Concern: Physical Activity - Inactive (11/25/2022)   Received from [facility]   Exercise Vital Sign    Days of Exercise per Week: 0 days    Minutes of Exercise per Session: 40 min  Stress: No Stress Concern Present (11/25/2022)   Received from [facility] - Occupational Stress Questionnaire    Feeling of Stress : Not at all  Social Connections: Socially Integrated (11/25/2022)   Received from [facility]   Social Network    How would you rate your social network (family, work, friends)?: Good participation with social networks  Intimate Partner Violence: Not At Risk (11/25/2022)   Received from [facility]   HITS    Over the last 12 months how often did your  partner physically hurt you?: Never    Over the last 12 months how often did your partner insult you or talk down to you?: Never    Over the last 12 months how often did your partner threaten you with physical harm?: Never    Over the last 12 months how often did your partner scream or curse at you?: Never    Family History  Problem Relation Age of Onset   Hypertension Mother    Diabetes Mother    Heart disease Maternal Grandfather    Osteoporosis Maternal Aunt    Colon polyps Brother 19       precancerous polyps    Past Medical History:  Diagnosis Date   Asthma    Follows w/ PCP. Per pt on 02/07/23, no recent exacerbations.   COVID-19 11/2021   w/ asthma exacerbation,  respiratory distress, hospital admission on 12/20/21   History of anemia 2019   due to heavy vag bleeding    Migraine with aura    gets ~ 1-2 a month - controlled with medication , Follows w/ Dr. Onetha Epp, neurologist.   PONV (postoperative nausea and vomiting)    Skene's gland abscess 2024   Wears glasses     Patient Active Problem List   Diagnosis Date Noted   SUI (stress urinary incontinence, female) 02/24/2023   Vertigo 02/24/2023   Recurrent UTI 02/03/2023   Fever 02/03/2023   Left flank pain 02/03/2023   Skene's gland abscess 09/06/2022   COVID-19 virus infection 12/21/2021   Acute respiratory failure (HCC) 12/20/2021   Vitamin D  deficiency 06/24/2018   IDA (iron deficiency anemia) 09/18/2017   Migraines 09/05/2017   Mild intermittent asthma with acute exacerbation 06/23/2015    Past Surgical History:  Procedure Laterality Date   BREAST REDUCTION SURGERY  1994   CYSTOSCOPY N/A 12/08/2017   Procedure: CYSTOSCOPY;  Surgeon: Cleotilde Ronal RAMAN, MD;  Location: [facility];  Service: Gynecology;  Laterality: N/A;   CYSTOSCOPY  2024   Urology in [place], [place]   CYSTOSCOPY N/A 02/10/2023   Procedure: CYSTOSCOPY;  Surgeon: Marilynne Rosaline SAILOR, MD;  Location: [facility];  Service: Gynecology;  Laterality: N/A;   TOTAL LAPAROSCOPIC HYSTERECTOMY WITH SALPINGECTOMY Bilateral 12/08/2017   Procedure: TOTAL LAPAROSCOPIC HYSTERECTOMY WITH SALPINGECTOMY;  Surgeon: Cleotilde Ronal RAMAN, MD;  Location: [facility];  Service: Gynecology;  Laterality: Bilateral;  specimen weight 233 g   URETHRAL CYST REMOVAL N/A 02/10/2023   Procedure: EXCISION URETHRAL CYST;  Surgeon: Marilynne Rosaline SAILOR, MD;  Location[facility];  Service: Gynecology;  Laterality: N/A;    Current Outpatient Medications  Medication Sig Dispense Refill   acetaminophen  (TYLENOL ) 500 MG tablet Take 1 tablet (500 mg total) by mouth every 6 (six) hours as needed (pain).  30 tablet 0   albuterol  (VENTOLIN  HFA) 108 (90 Base) MCG/ACT inhaler Inhale 1-2 puffs into the lungs every 6 (six) hours as needed for wheezing or shortness of breath. (Patient taking differently: Inhale 1-2 puffs into the lungs as needed for wheezing or shortness of breath.) 1 each 0   Ascorbic Acid (VITAMIN C PO) Take by mouth.     Atogepant (QULIPTA) 60 MG TABS Take 1 tablet (60 mg total) by mouth daily. 30 tablet 11   atorvastatin (LIPITOR) 20 MG tablet Take 20 mg by mouth at bedtime.     Cholecalciferol (VITAMIN D -3 PO) Take 1 capsule by mouth daily.     Cyanocobalamin  (B-12 PO) Take by  mouth.     cyclobenzaprine  (FLEXERIL ) 5 MG tablet Take 1-2 tablets (5-10 mg total) by mouth every 8 (eight) hours as needed for muscle spasms. 60 tablet 6   fluconazole  (DIFLUCAN ) 150 MG tablet Take 1 tablet (150 mg total) by mouth every other day as needed. 2 tablet 2   fluticasone (FLONASE) 50 MCG/ACT nasal spray Place 2 sprays into both nostrils 2 (two) times daily.      ipratropium-albuterol  (DUONEB) 0.5-2.5 (3) MG/3ML SOLN Take 3 mLs by nebulization every 4 (four) hours as needed for up to 20 days. 360 mL 0   meclizine  (ANTIVERT ) 25 MG tablet Take 1 tablet (25 mg total) by mouth 3 (three) times daily as needed for dizziness. 30 tablet 0   methylPREDNISolone  (MEDROL  DOSEPAK) 4 MG TBPK tablet Take pills daily all together with food. Take the first dose (6 pills) as soon as possible. Take the rest each morning. For 6 days total 6-5-4-3-2-1. (Patient taking differently: as needed. Take pills daily all together with food. Take the first dose (6 pills) as soon as possible. Take the rest each morning. For 6 days total 6-5-4-3-2-1.  For migraines.) 21 tablet 1   ondansetron  (ZOFRAN -ODT) 4 MG disintegrating tablet Take 1-2 tablets (4-8 mg total) by mouth every 8 (eight) hours as needed. 30 tablet 11   oxyCODONE  (OXY IR/ROXICODONE ) 5 MG immediate release tablet Take 1 tablet (5 mg total) by mouth every 4 (four) hours  as needed for severe pain (pain score 7-10). 5 tablet 0   Rimegepant Sulfate (NURTEC) 75 MG TBDP Take 1 tablet (75 mg total) by mouth daily as needed. 16 tablet 11   No current facility-administered medications for this visit.    Allergies as of 10/14/2023 - Review Complete 06/25/2023  Allergen Reaction Noted   Aspirin Anaphylaxis 12/29/2013   Nsaids Anaphylaxis 04/12/2021   Shellfish allergy Anaphylaxis 11/10/2017    Vitals: LMP 11/06/2017 (Exact Date)  Last Weight:  Wt Readings from Last 1 Encounters:  04/07/23 193 lb 3.2 oz (87.6 kg)   Last Height:   Ht Readings from Last 1 Encounters:  04/07/23 5' 5 (1.651 m)     Physical exam: Exam: Gen: NAD, conversant      CV: No palpitations or chest pain or SOB. VS: Breathing at a normal rate. Weight appears within normal limits. Not febrile. Eyes: Conjunctivae clear without exudates or hemorrhage  Neuro: Detailed Neurologic Exam  Speech:    Speech is normal; fluent and spontaneous with normal comprehension.  Cognition:    The patient is oriented to person, place, and time;     recent and remote memory intact;     language fluent;     normal attention, concentration, fund of knowledge Cranial Nerves:    The pupils are equal, round, and reactive to light. Visual fields are full Extraocular movements are intact.  The face is symmetric with normal sensation. The palate elevates in the midline. Hearing intact. Voice is normal. Shoulder shrug is normal. The tongue has normal motion without fasciculations.   Coordination: normal  Gait:    No abnormalities noted or reported  Motor Observation:   no involuntary movements noted. Tone:    Appears normal  Posture:    Posture is normal. normal erect    Strength:    Strength is anti-gravity and symmetric in the upper and lower limbs.      Sensation: intact to LT, no reports of numbness or tingling or paresthesias  Assessment/Plan:  Patient with migraines doing  well on qulipta. New right-sided occipital neralgia causing her current pain.    Prior to [place] she was having 12 migraine days a month that could last all day long and daily headache now has 5 migraine days a month and <10 total headache days a month. She is very happy with the qulipta, she has been on it for 2 years, has had great improvement, the residual migraines are milder and easier to treat and her rescue medication is nurtec and relpax .  Continue qulipta for prevention Acute: Nurtec and the Relpax . Can also add 4mg  Ondansetron  to this. Take them separately or all together. Can repeat ondansetron  4mg  and relpax  in 2 hours if head still there.  Nausea: Ondansetron  alone Discussed occipital neuralgia, performed nerve blocks at last appointment   Meds ordered this encounter  Medications   Atogepant (QULIPTA) 60 MG TABS    Sig: Take 1 tablet (60 mg total) by mouth daily.    Dispense:  30 tablet    Refill:  11   Rimegepant Sulfate (NURTEC) 75 MG TBDP    Sig: Take 1 tablet (75 mg total) by mouth daily as needed.    Dispense:  16 tablet    Refill:  11    Cc: Gladystine Erminio CROME, MD,  Gladystine Erminio CROME, MD  Onetha Epp, MD  [facility] [address] [place], [place] [address]  Phone [phone] Fax [phone]

## 2023-10-14 NOTE — Telephone Encounter (Signed)
 Follow up in one year with an NP video fo med refill/med check

## 2023-10-14 NOTE — Telephone Encounter (Signed)
 Called and spoke with patient to schedule f/u for next year. She stated she would call back to schedule

## 2023-10-21 ENCOUNTER — Other Ambulatory Visit: Payer: Self-pay

## 2023-10-21 ENCOUNTER — Encounter (HOSPITAL_BASED_OUTPATIENT_CLINIC_OR_DEPARTMENT_OTHER): Payer: Self-pay | Admitting: Emergency Medicine

## 2023-10-21 ENCOUNTER — Emergency Department (HOSPITAL_BASED_OUTPATIENT_CLINIC_OR_DEPARTMENT_OTHER)
Admission: EM | Admit: 2023-10-21 | Discharge: 2023-10-21 | Disposition: A | Discharge status: Home / Self Care | Attending: Emergency Medicine | Admitting: Emergency Medicine

## 2023-10-21 DIAGNOSIS — R000 Tachycardia, unspecified: Secondary | ICD-10-CM | POA: Insufficient documentation

## 2023-10-21 DIAGNOSIS — R509 Fever, unspecified: Secondary | ICD-10-CM | POA: Diagnosis present

## 2023-10-21 DIAGNOSIS — J101 Influenza due to other identified influenza virus with other respiratory manifestations: Secondary | ICD-10-CM | POA: Diagnosis not present

## 2023-10-21 LAB — CBC WITH DIFFERENTIAL/PLATELET
Abs Immature Granulocytes: 0.03 10*3/uL (ref 0.00–0.07)
Basophils Absolute: 0 10*3/uL (ref 0.0–0.1)
Basophils Relative: 0 %
Eosinophils Absolute: 0 10*3/uL (ref 0.0–0.5)
Eosinophils Relative: 0 %
HCT: 38.2 % (ref 36.0–46.0)
Hemoglobin: 12.9 g/dL (ref 12.0–15.0)
Immature Granulocytes: 1 %
Lymphocytes Relative: 6 %
Lymphs Abs: 0.3 10*3/uL — ABNORMAL LOW (ref 0.7–4.0)
MCH: 29.5 pg (ref 26.0–34.0)
MCHC: 33.8 g/dL (ref 30.0–36.0)
MCV: 87.2 fL (ref 80.0–100.0)
Monocytes Absolute: 0.6 10*3/uL (ref 0.1–1.0)
Monocytes Relative: 10 %
Neutro Abs: 4.7 10*3/uL (ref 1.7–7.7)
Neutrophils Relative %: 83 %
Platelets: 223 10*3/uL (ref 150–400)
RBC: 4.38 MIL/uL (ref 3.87–5.11)
RDW: 12.7 % (ref 11.5–15.5)
WBC: 5.6 10*3/uL (ref 4.0–10.5)
nRBC: 0 % (ref 0.0–0.2)

## 2023-10-21 LAB — GROUP A STREP BY PCR: Group A Strep by PCR: NOT DETECTED

## 2023-10-21 LAB — COMPREHENSIVE METABOLIC PANEL WITH GFR
ALT: 15 U/L (ref 0–44)
AST: 20 U/L (ref 15–41)
Albumin: 4.1 g/dL (ref 3.5–5.0)
Alkaline Phosphatase: 104 U/L (ref 38–126)
Anion gap: 12 (ref 5–15)
BUN: 12 mg/dL (ref 6–20)
CO2: 22 mmol/L (ref 22–32)
Calcium: 9.4 mg/dL (ref 8.9–10.3)
Chloride: 103 mmol/L (ref 98–111)
Creatinine, Ser: 0.87 mg/dL (ref 0.44–1.00)
GFR, Estimated: >60 mL/min (ref >60)
Glucose, Bld: 91 mg/dL (ref 70–99)
Potassium: 3.7 mmol/L (ref 3.5–5.1)
Sodium: 137 mmol/L (ref 135–145)
Total Bilirubin: 0.4 mg/dL (ref 0.0–1.2)
Total Protein: 6.9 g/dL (ref 6.5–8.1)

## 2023-10-21 LAB — RESP PANEL BY RT-PCR (RSV, FLU A&B, COVID)  RVPGX2
Influenza A by PCR: POSITIVE — AB
Influenza B by PCR: NEGATIVE
Resp Syncytial Virus by PCR: NEGATIVE
SARS Coronavirus 2 by RT PCR: NEGATIVE

## 2023-10-21 MED ORDER — SODIUM CHLORIDE 0.9 % IV BOLUS
1000.0000 mL | Freq: Once | INTRAVENOUS | Status: AC
  Administered 2023-10-21: 1000 mL via INTRAVENOUS

## 2023-10-21 MED ORDER — DIPHENHYDRAMINE HCL 50 MG/ML IJ SOLN
12.5000 mg | Freq: Once | INTRAMUSCULAR | Status: AC
  Administered 2023-10-21: 12.5 mg via INTRAVENOUS
  Filled 2023-10-21: qty 1

## 2023-10-21 MED ORDER — METOCLOPRAMIDE HCL 10 MG PO TABS: 10.0000 mg | ORAL_TABLET | Freq: Three times a day (TID) | ORAL | 0 refills | Status: AC | PRN

## 2023-10-21 MED ORDER — METOCLOPRAMIDE HCL 5 MG/ML IJ SOLN
10.0000 mg | Freq: Once | INTRAMUSCULAR | Status: AC
  Administered 2023-10-21: 10 mg via INTRAVENOUS
  Filled 2023-10-21: qty 2

## 2023-10-21 NOTE — ED Triage Notes (Signed)
 C/o fever, body aches, headache, congestion, and exertional dyspnea starting last night. Denies n/v/d.  Started GLP1 last week.

## 2023-10-21 NOTE — ED Provider Notes (Signed)
 [place] EMERGENCY DEPARTMENT AT [facility] Provider Note   CSN: [id number] Arrival date & time: 10/21/23  9281     Patient presents with: Shortness of Breath and Generalized Body Aches   Stacy Ochoa is a 48 y.o. female with a history of migraines presenting to the ED with 3 days of body aches and 1 day of fever and headache.  She reports having sore throat 3 days ago that has largely resolved.  Now having headaches, muscle aches all over, congestion and coughing.  Suffers from migraines monthly - sees neurology for this.  No sick contacts in the house.  Nausea, but no diarrhea. Allergies or intolerance to NSAIDS.  Started ozempic last week.   HPI     Prior to Admission medications   Medication Sig Start Date End Date Taking? Authorizing Provider  metoCLOPramide (REGLAN) 10 MG tablet Take 1 tablet (10 mg total) by mouth every 8 (eight) hours as needed for up to 10 doses for refractory nausea / vomiting (Headache). 10/21/23  Yes Larsen Dungan, Donnice PARAS, MD  acetaminophen  (TYLENOL ) 500 MG tablet Take 1 tablet (500 mg total) by mouth every 6 (six) hours as needed (pain). 02/10/23   Marilynne Rosaline SAILOR, MD  albuterol  (VENTOLIN  HFA) 108 [id number] Base) MCG/ACT inhaler Inhale 1-2 puffs into the lungs every 6 (six) hours as needed for wheezing or shortness of breath. Patient taking differently: Inhale 1-2 puffs into the lungs as needed for wheezing or shortness of breath. 12/18/21   Jerral Meth, MD  Ascorbic Acid (VITAMIN C PO) Take by mouth.    [provider]  Atogepant  (QULIPTA ) 60 MG TABS Take 1 tablet (60 mg total) by mouth daily. 10/14/23   Ines Onetha NOVAK, MD  atorvastatin (LIPITOR) 20 MG tablet Take 20 mg by mouth at bedtime.    [provider]  Cholecalciferol (VITAMIN D -3 PO) Take 1 capsule by mouth daily.    [provider]  Cyanocobalamin  (B-12 PO) Take by mouth.    [provider]  cyclobenzaprine  (FLEXERIL ) 5 MG tablet Take 1-2 tablets  (5-10 mg total) by mouth every 8 (eight) hours as needed for muscle spasms. 10/29/22   Ines Onetha NOVAK, MD  fluconazole  (DIFLUCAN ) 150 MG tablet Take 1 tablet (150 mg total) by mouth every other day as needed. 02/03/23   Lo, Donna K, CNM  fluticasone (FLONASE) 50 MCG/ACT nasal spray Place 2 sprays into both nostrils 2 (two) times daily.  09/02/17   [provider]  ipratropium-albuterol  (DUONEB) 0.5-2.5 (3) MG/3ML SOLN Take 3 mLs by nebulization every 4 (four) hours as needed for up to 20 days. 12/18/21 10/29/22  Jerral Meth, MD  meclizine  (ANTIVERT ) 25 MG tablet Take 1 tablet (25 mg total) by mouth 3 (three) times daily as needed for dizziness. 02/19/23   Doretha Folks, MD  methylPREDNISolone  (MEDROL  DOSEPAK) 4 MG TBPK tablet Take pills daily all together with food. Take the first dose (6 pills) as soon as possible. Take the rest each morning. For 6 days total 6-5-4-3-2-1. Patient taking differently: as needed. Take pills daily all together with food. Take the first dose (6 pills) as soon as possible. Take the rest each morning. For 6 days total 6-5-4-3-2-1.  For migraines. 10/29/22   Ines Onetha NOVAK, MD  ondansetron  (ZOFRAN -ODT) 4 MG disintegrating tablet Take 1-2 tablets (4-8 mg total) by mouth every 8 (eight) hours as needed. 10/29/22   Ines Onetha NOVAK, MD  oxyCODONE  (OXY IR/ROXICODONE ) 5 MG immediate release tablet Take 1 tablet (  5 mg total) by mouth every 4 (four) hours as needed for severe pain (pain score 7-10). 02/10/23   Marilynne Rosaline SAILOR, MD  Rimegepant Sulfate (NURTEC) 75 MG TBDP Take 1 tablet (75 mg total) by mouth daily as needed. 10/14/23   Ines Onetha NOVAK, MD    Allergies: Aspirin, Nsaids, and Shellfish allergy    Review of Systems  Updated Vital Signs BP 120/82   Pulse (!) 102   Temp 99.9 F (37.7 C) (Oral)   Resp 15   LMP 11/06/2017 (Exact Date)   SpO2 96%   Physical Exam Constitutional:      General: She is not in acute distress. HENT:     Head:  Normocephalic and atraumatic.   Eyes:     Conjunctiva/sclera: Conjunctivae normal.     Pupils: Pupils are equal, round, and reactive to light.    Cardiovascular:     Rate and Rhythm: Regular rhythm. Tachycardia present.  Pulmonary:     Effort: Pulmonary effort is normal. No respiratory distress.  Abdominal:     General: There is no distension.     Tenderness: There is no abdominal tenderness.   Skin:    General: Skin is warm and dry.   Neurological:     General: No focal deficit present.     Mental Status: She is alert. Mental status is at baseline.   Psychiatric:        Mood and Affect: Mood normal.        Behavior: Behavior normal.     (all labs ordered are listed, but only abnormal results are displayed) Labs Reviewed  RESP PANEL BY RT-PCR (RSV, FLU A&B, COVID)  RVPGX2 - Abnormal; Notable for the following components:      Result Value   Influenza A by PCR POSITIVE (*)    All other components within normal limits  CBC WITH DIFFERENTIAL/PLATELET - Abnormal; Notable for the following components:   Lymphs Abs 0.3 (*)    All other components within normal limits  GROUP A STREP BY PCR  COMPREHENSIVE METABOLIC PANEL WITH GFR    EKG: EKG Interpretation Date/Time:  Tuesday October 21 2023 07:29:37 EDT Ventricular Rate:  103 PR Interval:  147 QRS Duration:  88 QT Interval:  345 QTC Calculation: 452 R Axis:   75  Text Interpretation: Sinus tachycardia Confirmed by Cottie Cough [phone]) on 10/21/2023 7:30:22 AM  Radiology: No results found.   Procedures   Medications Ordered in the ED  sodium chloride  0.9 % bolus 1,000 mL (0 mLs Intravenous Stopped 10/21/23 0912)  metoCLOPramide (REGLAN) injection 10 mg (10 mg Intravenous Given 10/21/23 0754)  diphenhydrAMINE (BENADRYL) injection 12.5 mg (12.5 mg Intravenous Given 10/21/23 0754)    Clinical Course as of 10/21/23 1023  Tue Oct 21, 2023  0837 Influenza A By PCR(!): POSITIVE [MT]    Clinical Course User Index [MT]  Sonu Kruckenberg, Cough PARAS, MD                                 Medical Decision Making Amount and/or Complexity of Data Reviewed Labs: ordered. Decision-making details documented in ED Course.  Risk Prescription drug management.   This patient presents to the ED with concern for symptoms as noted above. This involves an extensive number of treatment options, and is a complaint that carries with it a high risk of complications and morbidity.  The differential diagnosis includes viral syndrome vs bacterial infection including  PNA vs metabolic derangement vs complex migraine or other headache type  I do not think these symptoms correlate with her initiation of Ozempic. This sounds more likely infectious in etiology, and most likely viral.  Co-morbidities that complicate the patient evaluation: hx of migraines  Additional history obtained from husband  External records from outside source obtained and reviewed including neurology evaluation noting patient has 5-6 daily migraines per month on her Qulipta  medication  I ordered and personally interpreted labs.  The pertinent results include:  Influenza positive.   The patient was maintained on a cardiac monitor.  I personally viewed and interpreted the cardiac monitored which showed an underlying rhythm of: sinus rhythm and sinus tachycardia  Per my interpretation the patient's ECG shows sinus tachycardia with no acute ischemia  I ordered medication including IV fluids and migraine medications  I have reviewed the patients home medicines and have made adjustments as needed  Test Considered: low suspicion for ICH, meningitis, or other life threatening cause of headache. Doubt sepsis, acute PE, or intraabdominal or pelvic infection. Lungs CTAB - lower suspicion for asthma exacerbation  With a positive influenza test and no leukocytosis, I have a low suspicion for bacterial PNA.  No indication for chest imaging at this time.   After the  interventions noted above, I reevaluated the patient and found that they have: improved    Disposition:  After consideration of the diagnostic results and the patient's response to treatment, I feel that the patent would benefit from outpatient PCP follow up.      Final diagnoses:  Influenza A    ED Discharge Orders          Ordered    metoCLOPramide (REGLAN) 10 MG tablet  Every 8 hours PRN        10/21/23 1023               Cottie Donnice PARAS, MD 10/21/23 1024

## 2023-10-21 NOTE — Discharge Instructions (Addendum)
 You were diagnosed with influenza.  This is a virus that can make you sick for 5 to 7 days on average.  Please quarantine, or wear a mask around other people, for the first 7 days of your symptoms.  A work note was provided.  You can use over the counter cold and flu medicine for general symptoms like headache, coughing, and sore throat.

## 2023-10-29 ENCOUNTER — Emergency Department (HOSPITAL_BASED_OUTPATIENT_CLINIC_OR_DEPARTMENT_OTHER)

## 2023-10-29 ENCOUNTER — Other Ambulatory Visit: Payer: Self-pay

## 2023-10-29 ENCOUNTER — Emergency Department (HOSPITAL_BASED_OUTPATIENT_CLINIC_OR_DEPARTMENT_OTHER)
Admission: EM | Admit: 2023-10-29 | Discharge: 2023-10-29 | Disposition: A | Discharge status: Home / Self Care | Attending: Emergency Medicine | Admitting: Emergency Medicine

## 2023-10-29 DIAGNOSIS — R0602 Shortness of breath: Secondary | ICD-10-CM | POA: Diagnosis present

## 2023-10-29 DIAGNOSIS — E876 Hypokalemia: Secondary | ICD-10-CM | POA: Insufficient documentation

## 2023-10-29 DIAGNOSIS — J45909 Unspecified asthma, uncomplicated: Secondary | ICD-10-CM | POA: Diagnosis not present

## 2023-10-29 LAB — BASIC METABOLIC PANEL WITH GFR
Anion gap: 13 (ref 5–15)
BUN: 13 mg/dL (ref 6–20)
CO2: 23 mmol/L (ref 22–32)
Calcium: 10.1 mg/dL (ref 8.9–10.3)
Chloride: 104 mmol/L (ref 98–111)
Creatinine, Ser: 0.84 mg/dL (ref 0.44–1.00)
GFR, Estimated: >60 mL/min (ref >60)
Glucose, Bld: 93 mg/dL (ref 70–99)
Potassium: 3.3 mmol/L — ABNORMAL LOW (ref 3.5–5.1)
Sodium: 141 mmol/L (ref 135–145)

## 2023-10-29 LAB — CBC
HCT: 42.8 % (ref 36.0–46.0)
Hemoglobin: 14.5 g/dL (ref 12.0–15.0)
MCH: 29.5 pg (ref 26.0–34.0)
MCHC: 33.9 g/dL (ref 30.0–36.0)
MCV: 87.2 fL (ref 80.0–100.0)
Platelets: 324 K/uL (ref 150–400)
RBC: 4.91 MIL/uL (ref 3.87–5.11)
RDW: 12.4 % (ref 11.5–15.5)
WBC: 8.4 K/uL (ref 4.0–10.5)
nRBC: 0 % (ref 0.0–0.2)

## 2023-10-29 LAB — TROPONIN T, HIGH SENSITIVITY: Troponin T High Sensitivity: <15 ng/L (ref <19)

## 2023-10-29 LAB — PREGNANCY, URINE: Preg Test, Ur: NEGATIVE

## 2023-10-29 LAB — PRO BRAIN NATRIURETIC PEPTIDE: Pro Brain Natriuretic Peptide: 45.7 pg/mL (ref <300.0)

## 2023-10-29 LAB — D-DIMER, QUANTITATIVE: D-Dimer, Quant: 0.3 ug{FEU}/mL (ref 0.00–0.50)

## 2023-10-29 MED ORDER — IPRATROPIUM-ALBUTEROL 0.5-2.5 (3) MG/3ML IN SOLN
3.0000 mL | Freq: Once | RESPIRATORY_TRACT | Status: AC
  Administered 2023-10-29: 3 mL via RESPIRATORY_TRACT
  Filled 2023-10-29: qty 3

## 2023-10-29 MED ORDER — ALBUTEROL SULFATE HFA 108 (90 BASE) MCG/ACT IN AERS
1.0000 | INHALATION_SPRAY | Freq: Once | RESPIRATORY_TRACT | Status: AC
  Administered 2023-10-29: 1 via RESPIRATORY_TRACT
  Filled 2023-10-29: qty 6.7

## 2023-10-29 MED ORDER — PREDNISONE 50 MG PO TABS
60.0000 mg | ORAL_TABLET | Freq: Once | ORAL | Status: AC
  Administered 2023-10-29: 60 mg via ORAL
  Filled 2023-10-29: qty 1

## 2023-10-29 MED ORDER — ALBUTEROL SULFATE HFA 108 (90 BASE) MCG/ACT IN AERS: 1.0000 | INHALATION_SPRAY | Freq: Four times a day (QID) | RESPIRATORY_TRACT | 2 refills | Status: AC | PRN

## 2023-10-29 MED ORDER — POTASSIUM CHLORIDE CRYS ER 20 MEQ PO TBCR
40.0000 meq | EXTENDED_RELEASE_TABLET | Freq: Once | ORAL | Status: AC
  Administered 2023-10-29: 40 meq via ORAL
  Filled 2023-10-29: qty 2

## 2023-10-29 MED ORDER — PREDNISONE 10 MG PO TABS: 30.0000 mg | ORAL_TABLET | Freq: Every day | ORAL | 0 refills | Status: AC

## 2023-10-29 NOTE — ED Notes (Signed)
 RT ambulated with patient with pulse ox. Pt started feeling weak once we were fixing to exit the room and I had to pull a chair under her. Oxygen level remained 92-100% MD is aware.

## 2023-10-29 NOTE — ED Notes (Signed)
 Respiratory attempted to ambulate pt while monitoring pulse ox. Pt made it ~ to door of room and had to be assisted by staff into chair, pt reported she felt dizzy/woozy and needed to sit down. EDP notified. Pt assisted back into stretcher and placed back on monitor. Pt in NAD

## 2023-10-29 NOTE — ED Notes (Signed)
 Pt d/c instructions, medications, and follow-up care reviewed with pt and husband. Pt and husband verbalized understanding and had no further questions at time of d/c. Pt CA&Ox4 and in NAD at time of d/c

## 2023-10-29 NOTE — ED Triage Notes (Signed)
 Pt arrived via [facility] from PCP. Pt c/o increased SOB on exertion and CP x1.5 wks. Dx flu A 7/1. Prescribed abx 7/5 for pneumonia by PCP, states she hasn't been able to take because it was causing her nausea.

## 2023-10-29 NOTE — ED Provider Notes (Signed)
 [place] [facility] DEPARTMENT AT [facility] Provider Note   CSN: [id number] Arrival date & time: 10/29/23  1652     Patient presents with: Shortness of Breath  HPI Stacy Ochoa is a 48 y.o. female with asthma, iron deficient anemia presenting for shortness of breath. Has been going on for about a week and a half but worsening in the last couple of days.  Also endorsing intermittent left-sided chest pain that is worse with deep breathing.  No chest pain at this time.  Shortness of breath is worse with exertion.  She was diagnosed with the flu on July 1.  She returned to her PCP and was started on antibiotics on July 5 for presumed pneumonia.  She stopped taking them a couple of days ago because it made her nauseous.  Does report that she went to [place]  by car a couple weeks ago.  Denies lower extremity swelling or tenderness, OCP use, or known history of blood clots.    Shortness of Breath      Prior to Admission medications   Medication Sig Start Date End Date Taking? Authorizing Provider  albuterol  (VENTOLIN  HFA) 108 (90 Base) MCG/ACT inhaler Inhale 1-2 puffs into the lungs every 6 (six) hours as needed for wheezing or shortness of breath. 10/29/23  Yes Eileene Kisling K, PA-C  predniSONE  (DELTASONE ) 10 MG tablet Take 3 tablets (30 mg total) by mouth daily for 4 days. 10/29/23 11/02/23 Yes Ladaja Yusupov K, PA-C  acetaminophen  (TYLENOL ) 500 MG tablet Take 1 tablet (500 mg total) by mouth every 6 (six) hours as needed (pain). 02/10/23   Marilynne Rosaline SAILOR, MD  Ascorbic Acid (VITAMIN C PO) Take by mouth.    [provider]  Atogepant  (QULIPTA ) 60 MG TABS Take 1 tablet (60 mg total) by mouth daily. 10/14/23   Ines Onetha NOVAK, MD  atorvastatin (LIPITOR) 20 MG tablet Take 20 mg by mouth at bedtime.    [provider]  Cholecalciferol (VITAMIN D -3 PO) Take 1 capsule by mouth daily.    [provider]  Cyanocobalamin  (B-12 PO) Take by mouth.    [provider]  cyclobenzaprine  (FLEXERIL ) 5 MG tablet Take 1-2 tablets (5-10 mg total) by mouth every 8 (eight) hours as needed for muscle spasms. 10/29/22   Ines Onetha NOVAK, MD  fluconazole  (DIFLUCAN ) 150 MG tablet Take 1 tablet (150 mg total) by mouth every other day as needed. 02/03/23   Lo, Donna K, CNM  fluticasone (FLONASE) 50 MCG/ACT nasal spray Place 2 sprays into both nostrils 2 (two) times daily.  09/02/17   [provider]  ipratropium-albuterol  (DUONEB) 0.5-2.5 (3) MG/3ML SOLN Take 3 mLs by nebulization every 4 (four) hours as needed for up to 20 days. 12/18/21 10/29/22  Jerral Meth, MD  meclizine  (ANTIVERT ) 25 MG tablet Take 1 tablet (25 mg total) by mouth 3 (three) times daily as needed for dizziness. 02/19/23   Doretha Folks, MD  methylPREDNISolone  (MEDROL  DOSEPAK) 4 MG TBPK tablet Take pills daily all together with food. Take the first dose (6 pills) as soon as possible. Take the rest each morning. For 6 days total 6-5-4-3-2-1. Patient taking differently: as needed. Take pills daily all together with food. Take the first dose (6 pills) as soon as possible. Take the rest each morning. For 6 days total 6-5-4-3-2-1.  For migraines. 10/29/22   Ines Onetha NOVAK, MD  metoCLOPramide  (REGLAN ) 10 MG tablet Take 1 tablet (10 mg total) by mouth every 8 (eight) hours as  needed for up to 10 doses for refractory nausea / vomiting (Headache). 10/21/23   Cottie Donnice PARAS, MD  ondansetron  (ZOFRAN -ODT) 4 MG disintegrating tablet Take 1-2 tablets (4-8 mg total) by mouth every 8 (eight) hours as needed. 10/29/22   Ines Onetha NOVAK, MD  oxyCODONE  (OXY IR/ROXICODONE ) 5 MG immediate release tablet Take 1 tablet (5 mg total) by mouth every 4 (four) hours as needed for severe pain (pain score 7-10). 02/10/23   Marilynne Rosaline SAILOR, MD  Rimegepant Sulfate (NURTEC) 75 MG TBDP Take 1 tablet (75 mg total) by mouth daily as needed. 10/14/23   Ines Onetha NOVAK, MD    Allergies: Aspirin, Nsaids, and  Shellfish allergy    Review of Systems  Respiratory:  Positive for shortness of breath.     Updated Vital Signs BP (!) 124/90   Pulse 82   Temp 98.7 F (37.1 C) (Oral)   Resp 11   LMP 11/06/2017 (Exact Date)   SpO2 100%   Physical Exam Vitals and nursing note reviewed.  HENT:     Head: Normocephalic and atraumatic.     Mouth/Throat:     Mouth: Mucous membranes are moist.  Eyes:     General:        Right eye: No discharge.        Left eye: No discharge.     Conjunctiva/sclera: Conjunctivae normal.  Cardiovascular:     Rate and Rhythm: Normal rate and regular rhythm.     Pulses: Normal pulses.     Heart sounds: Normal heart sounds.  Pulmonary:     Effort: Pulmonary effort is normal.     Breath sounds: Normal breath sounds and air entry. No decreased breath sounds, wheezing, rhonchi or rales.  Abdominal:     General: Abdomen is flat.     Palpations: Abdomen is soft.  Musculoskeletal:     Comments: No lower extremity edema  Skin:    General: Skin is warm and dry.  Neurological:     General: No focal deficit present.  Psychiatric:        Mood and Affect: Mood normal.     (all labs ordered are listed, but only abnormal results are displayed) Labs Reviewed  BASIC METABOLIC PANEL WITH GFR - Abnormal; Notable for the following components:      Result Value   Potassium 3.3 (*)    All other components within normal limits  CBC  PREGNANCY, URINE  PRO BRAIN NATRIURETIC PEPTIDE  D-DIMER, QUANTITATIVE  TROPONIN T, HIGH SENSITIVITY  TROPONIN T, HIGH SENSITIVITY    EKG: None  Radiology: [facility] Chest Port 1 View Result Date: 10/29/2023 CLINICAL DATA:  cp EXAM: PORTABLE CHEST - 1 VIEW COMPARISON:  February 17, 2023 FINDINGS: No focal airspace consolidation, pleural effusion, or pneumothorax. No cardiomegaly.No acute fracture or destructive lesion. IMPRESSION: No acute cardiopulmonary abnormality. Electronically Signed   By: Rogelia Myers M.D.   On: 10/29/2023 17:16      Procedures   Medications Ordered in the ED  potassium chloride  SA (KLOR-CON  M) CR tablet 40 mEq (has no administration in time range)  predniSONE  (DELTASONE ) tablet 60 mg (has no administration in time range)  albuterol  (VENTOLIN  HFA) 108 (90 Base) MCG/ACT inhaler 1 puff (has no administration in time range)  ipratropium-albuterol  (DUONEB) 0.5-2.5 (3) MG/3ML nebulizer solution 3 mL (3 mLs Nebulization Given 10/29/23 1814)  Medical Decision Making Amount and/or Complexity of Data Reviewed Labs: ordered. Radiology: ordered.  Risk Prescription drug management.   Initial Impression and Ddx 48 yo well appearing female presenting for shortness of breath. Exam unremarkable. Ddx pneumonia, PE, ACS, CHF exacerbation, COPD exacerbation, URI, symptomatic anemia, other. Patient PMH that increases complexity of ED encounter: asthma, iron deficient anemia  Interpretation of Diagnostics - I independent reviewed and interpreted the labs as followed: none  - I independently visualized the following imaging with scope of interpretation limited to determining acute life threatening conditions related to emergency care: CXR, which revealed no acute abnormality  - I personally reviewed and interpreted EKG which revealed sinus rhythm  Patient Reassessment and Ultimate Disposition/Management Workup unremarkable and does not suggest PE, CHF or COPD or ACS.  Ambulated patient and she was stating that she was short of breath but respiratory rate was normal and she was not hypoxic.  Suspect her shortness of breath is likely related to recent flu.  Sent her home with albuterol  inhaler and ordered a short course of prednisone .  Advised her to follow-up PCP.  Discussed return precautions.  Discharged.  Patient management required discussion with the following services or consulting groups:  None  Complexity of Problems Addressed Acute complicated illness or  Injury  Additional Data Reviewed and Analyzed Further history obtained from: Past medical history and medications listed in the EMR and Prior ED visit notes  Patient Encounter Risk Assessment Prescriptions      Final diagnoses:  Shortness of breath    ED Discharge Orders          Ordered    albuterol  (VENTOLIN  HFA) 108 (90 Base) MCG/ACT inhaler  Every 6 hours PRN        10/29/23 1839    predniSONE  (DELTASONE ) 10 MG tablet  Daily        10/29/23 1839               Lang Norleen POUR, PA-C 10/29/23 1841    Geraldene Hamilton, MD 10/30/23 [phone]

## 2023-10-29 NOTE — Discharge Instructions (Addendum)
 Evaluation for your shortness of breath was overall reassuring.  I suspect it is related to the fact that you have had the flu.  I sent albuterol  inhaler and a short course of steroids to your pharmacy to help with your symptoms.  Please follow-up your PCP.  If your symptoms worsen please return to the ED for further evaluation.

## 2023-11-04 ENCOUNTER — Institutional Professional Consult (permissible substitution): Admitting: Plastic Surgery

## 2023-11-27 ENCOUNTER — Institutional Professional Consult (permissible substitution): Admitting: Plastic Surgery

## 2024-01-23 ENCOUNTER — Other Ambulatory Visit (HOSPITAL_COMMUNITY): Payer: Self-pay

## 2024-01-23 ENCOUNTER — Telehealth: Payer: Self-pay

## 2024-01-23 ENCOUNTER — Encounter: Payer: Self-pay | Admitting: Family

## 2024-01-23 NOTE — Telephone Encounter (Signed)
 Pharmacy Patient Advocate Encounter   Received notification from CoverMyMeds that prior authorization for Qulipta  60mg  Tablet is required/requested.   Insurance verification completed.   The patient is insured through [facility].   Per test claim: PA required; PA submitted to above mentioned insurance via Latent Key/confirmation #/EOC [facility] Status is pending

## 2024-01-27 NOTE — Telephone Encounter (Signed)
 Pharmacy Patient Advocate Encounter  Received notification from [employer] that Prior Authorization for Qulipta  60mg  Tablet has been APPROVED from 01/23/2024 to 01/22/2025   PA #/Case ID/Reference #: [id number]
